# Patient Record
Sex: Male | Born: 1960 | Race: White | Hispanic: No | Marital: Married | Smoking: Never smoker
Health system: Southern US, Community
[De-identification: ages and names within clinical notes are randomized; demographics above are authoritative.]

## PROBLEM LIST (undated history)

## (undated) DIAGNOSIS — I10 Essential (primary) hypertension: Secondary | ICD-10-CM

## (undated) DIAGNOSIS — Z8619 Personal history of other infectious and parasitic diseases: Secondary | ICD-10-CM

## (undated) HISTORY — DX: Personal history of other infectious and parasitic diseases: Z86.19

## (undated) HISTORY — DX: Essential (primary) hypertension: I10

---

## 1998-09-27 HISTORY — PX: HERNIA REPAIR: SHX51

## 1999-02-23 ENCOUNTER — Encounter: Payer: Self-pay | Admitting: Emergency Medicine

## 1999-02-23 ENCOUNTER — Emergency Department (HOSPITAL_COMMUNITY): Admission: EM | Admit: 1999-02-23 | Discharge: 1999-02-23 | Payer: Self-pay | Admitting: Emergency Medicine

## 1999-02-26 ENCOUNTER — Emergency Department (HOSPITAL_COMMUNITY): Admission: EM | Admit: 1999-02-26 | Discharge: 1999-02-26 | Payer: Self-pay | Admitting: Emergency Medicine

## 1999-02-27 ENCOUNTER — Emergency Department (HOSPITAL_COMMUNITY): Admission: EM | Admit: 1999-02-27 | Discharge: 1999-02-28 | Payer: Self-pay | Admitting: Emergency Medicine

## 1999-02-28 ENCOUNTER — Encounter: Payer: Self-pay | Admitting: Emergency Medicine

## 1999-02-28 ENCOUNTER — Inpatient Hospital Stay (HOSPITAL_COMMUNITY): Admission: EM | Admit: 1999-02-28 | Discharge: 1999-03-01 | Payer: Self-pay | Admitting: *Deleted

## 1999-09-25 ENCOUNTER — Emergency Department (HOSPITAL_COMMUNITY): Admission: EM | Admit: 1999-09-25 | Discharge: 1999-09-25 | Payer: Self-pay | Admitting: Emergency Medicine

## 2005-04-29 ENCOUNTER — Ambulatory Visit: Payer: Self-pay | Admitting: Internal Medicine

## 2005-05-17 ENCOUNTER — Ambulatory Visit: Payer: Self-pay | Admitting: Internal Medicine

## 2005-12-02 ENCOUNTER — Ambulatory Visit: Payer: Self-pay | Admitting: Internal Medicine

## 2006-12-08 ENCOUNTER — Ambulatory Visit: Payer: Self-pay | Admitting: Internal Medicine

## 2007-01-10 ENCOUNTER — Ambulatory Visit: Payer: Self-pay | Admitting: Internal Medicine

## 2007-05-04 ENCOUNTER — Ambulatory Visit: Payer: Self-pay | Admitting: Internal Medicine

## 2007-05-04 DIAGNOSIS — I10 Essential (primary) hypertension: Secondary | ICD-10-CM | POA: Insufficient documentation

## 2007-05-04 DIAGNOSIS — F411 Generalized anxiety disorder: Secondary | ICD-10-CM | POA: Insufficient documentation

## 2007-05-04 LAB — CONVERTED CEMR LAB
ALT: 21 units/L (ref 0–53)
AST: 19 units/L (ref 0–37)
Albumin: 3.9 g/dL (ref 3.5–5.2)
Alkaline Phosphatase: 71 units/L (ref 39–117)
BUN: 7 mg/dL (ref 6–23)
Basophils Absolute: 0.1 10*3/uL (ref 0.0–0.1)
Basophils Relative: 0.6 % (ref 0.0–1.0)
Bilirubin Urine: NEGATIVE
Bilirubin, Direct: 0.1 mg/dL (ref 0.0–0.3)
Blood in Urine, dipstick: NEGATIVE
CO2: 29 meq/L (ref 19–32)
Calcium: 9.9 mg/dL (ref 8.4–10.5)
Chloride: 104 meq/L (ref 96–112)
Cholesterol: 192 mg/dL (ref 0–200)
Creatinine, Ser: 0.9 mg/dL (ref 0.4–1.5)
Eosinophils Absolute: 0.2 10*3/uL (ref 0.0–0.6)
Eosinophils Relative: 1.5 % (ref 0.0–5.0)
GFR calc Af Amer: 118 mL/min
GFR calc non Af Amer: 97 mL/min
Glucose, Bld: 108 mg/dL — ABNORMAL HIGH (ref 70–99)
Glucose, Urine, Semiquant: NEGATIVE
HCT: 42.2 % (ref 39.0–52.0)
HDL: 36.2 mg/dL — ABNORMAL LOW (ref >39.0)
Hemoglobin: 14.6 g/dL (ref 13.0–17.0)
Ketones, urine, test strip: NEGATIVE
LDL Cholesterol: 126 mg/dL — ABNORMAL HIGH (ref 0–99)
Lymphocytes Relative: 29.2 % (ref 12.0–46.0)
MCHC: 34.6 g/dL (ref 30.0–36.0)
MCV: 86.5 fL (ref 78.0–100.0)
Monocytes Absolute: 0.7 10*3/uL (ref 0.2–0.7)
Monocytes Relative: 6.4 % (ref 3.0–11.0)
Neutro Abs: 6.4 10*3/uL (ref 1.4–7.7)
Neutrophils Relative %: 62.3 % (ref 43.0–77.0)
Nitrite: NEGATIVE
Platelets: 260 10*3/uL (ref 150–400)
Potassium: 4.2 meq/L (ref 3.5–5.1)
Protein, U semiquant: NEGATIVE
RBC: 4.88 M/uL (ref 4.22–5.81)
RDW: 12.1 % (ref 11.5–14.6)
Sodium: 138 meq/L (ref 135–145)
Specific Gravity, Urine: 1.02
TSH: 2.06 microintl units/mL (ref 0.35–5.50)
Total Bilirubin: 1.1 mg/dL (ref 0.3–1.2)
Total CHOL/HDL Ratio: 5.3
Total Protein: 6.8 g/dL (ref 6.0–8.3)
Triglycerides: 149 mg/dL (ref 0–149)
Urobilinogen, UA: 0.2
VLDL: 30 mg/dL (ref 0–40)
WBC Urine, dipstick: NEGATIVE
WBC: 10.4 10*3/uL (ref 4.5–10.5)
pH: 6

## 2007-05-11 ENCOUNTER — Ambulatory Visit: Payer: Self-pay | Admitting: Internal Medicine

## 2007-07-03 ENCOUNTER — Telehealth: Payer: Self-pay | Admitting: Internal Medicine

## 2008-08-16 ENCOUNTER — Telehealth: Payer: Self-pay | Admitting: Internal Medicine

## 2010-03-23 ENCOUNTER — Ambulatory Visit: Payer: Self-pay | Admitting: Family Medicine

## 2010-03-23 DIAGNOSIS — L219 Seborrheic dermatitis, unspecified: Secondary | ICD-10-CM

## 2010-03-23 DIAGNOSIS — L570 Actinic keratosis: Secondary | ICD-10-CM

## 2010-03-23 LAB — CONVERTED CEMR LAB
ALT: 13 units/L (ref 0–53)
AST: 12 units/L (ref 0–37)
Albumin: 4.3 g/dL (ref 3.5–5.2)
Alkaline Phosphatase: 94 units/L (ref 39–117)
BUN: 11 mg/dL (ref 6–23)
CO2: 26 meq/L (ref 19–32)
Calcium: 9.8 mg/dL (ref 8.4–10.5)
Chloride: 104 meq/L (ref 96–112)
Creatinine, Ser: 0.8 mg/dL (ref 0.40–1.50)
Glucose, Bld: 92 mg/dL (ref 70–99)
Potassium: 4.2 meq/L (ref 3.5–5.3)
Sodium: 141 meq/L (ref 135–145)
Total Bilirubin: 0.7 mg/dL (ref 0.3–1.2)
Total Protein: 7 g/dL (ref 6.0–8.3)

## 2010-05-06 ENCOUNTER — Ambulatory Visit: Payer: Self-pay | Admitting: Family Medicine

## 2010-08-31 ENCOUNTER — Ambulatory Visit: Payer: Self-pay | Admitting: Family Medicine

## 2010-10-27 NOTE — Assessment & Plan Note (Signed)
Summary: f/up,tcb   Vital Signs:  Patient profile:   50 year old male Height:      71.5 inches Weight:      217 pounds BMI:     29.95 BSA:     2.20 Temp:     98.0 degrees F Pulse rate:   63 / minute BP sitting:   160 / 100  Vitals Entered By: Jone Baseman CMA (May 06, 2010 9:39 AM) CC: f/u BP Is Patient Diabetic? No Pain Assessment Patient in pain? no        CC:  f/u BP.  History of Present Illness: HYPERTENSION Disease Monitoring   Blood pressure range: with his home meter readings range from 152/98 to 129/78 most below 140/90      Chest pain:  N    Dyspnea:N Medications   Compliance: none   Lightheadedness: N     Edema:N Prevention   Exercise: runs 3 times a week    Salt restriction:Y  Seb K no change in lesion on his Left arm  ROS - as above PMH - Medications reviewed and updated in medication list.  Smoking Status noted in VS form      Habits & Providers  Alcohol-Tobacco-Diet     Tobacco Status: never  Current Medications (verified): 1)  Triamcinolone Acetonide 0.1 % Oint (Triamcinolone Acetonide) .... Apply Two Times A Day To Rash Behind Ear 45 Gram  Allergies: No Known Drug Allergies  Physical Exam  General:  Well-developed,well-nourished,in no acute distress; alert,appropriate and cooperative throughout examination Lungs:  Normal respiratory effort, chest expands symmetrically. Lungs are clear to auscultation, no crackles or wheezes. Heart:  Normal rate and regular rhythm. S1 and S2 normal without gallop, murmur, click, rub or other extra sounds. Skin:  typical stuck on appearance of dark seb k on Left arm measures 7 mm    Impression & Recommendations:  Problem # 1:  HYPERTENSION (ICD-401.9)  Elevated on visits but according to home readings (his meter actually measures higher than ours) his blood pressure is well controlled.  Likely white coat hypertension.  Will monitor  BP today: 160/100 Prior BP: 162/96 (03/23/2010)  Labs  Reviewed: K+: 4.2 (03/23/2010) Creat: : 0.80 (03/23/2010)   Chol: 192 (05/04/2007)   HDL: 36.2 (05/04/2007)   LDL: 126 (05/04/2007)   TG: 149 (05/04/2007)  Orders: [facility]- Est Level  3 (60454)  Problem # 2:  ACTINIC KERATOSIS (ICD-702.0) Froze thaw froze today.  No complications  Complete Medication List: 1)  Triamcinolone Acetonide 0.1 % Oint (Triamcinolone acetonide) .... Apply two times a day to rash behind ear 45 gram  Patient Instructions: 1)  Please schedule a follow-up appointment in 6 months .  2)  Check your blood pressure regularly if usually above 150/95 with your meter then call us 3)  Bring in your readings next visit 4)  Try to exercise at least 4-5 days a week  Prevention & Chronic Care Immunizations   Influenza vaccine: Not documented    Tetanus booster: Not documented    Pneumococcal vaccine: Not documented  Other Screening   Smoking status: never  (05/06/2010)  Lipids   Total Cholesterol: 192  (05/04/2007)   LDL: 126  (05/04/2007)   LDL Direct: Not documented   HDL: 36.2  (05/04/2007)   Triglycerides: 149  (05/04/2007)  Hypertension   Last Blood Pressure: 160 / 100  (05/06/2010)   Serum creatinine: 0.80  (03/23/2010)   Serum potassium 4.2  (03/23/2010)    Hypertension flowsheet reviewed?: Yes  Progress toward BP goal: At goal  Self-Management Support :    Hypertension self-management support: Not documented   Prevention & Chronic Care Immunizations   Influenza vaccine: Not documented    Tetanus booster: Not documented    Pneumococcal vaccine: Not documented  Other Screening   Smoking status: never  (05/06/2010)  Lipids   Total Cholesterol: 192  (05/04/2007)   LDL: 126  (05/04/2007)   LDL Direct: Not documented   HDL: 36.2  (05/04/2007)   Triglycerides: 149  (05/04/2007)  Hypertension   Last Blood Pressure: 160 / 100  (05/06/2010)   Serum creatinine: 0.80  (03/23/2010)   Serum potassium 4.2  (03/23/2010)    Hypertension  flowsheet reviewed?: Yes   Progress toward BP goal: At goal  Self-Management Support :    Hypertension self-management support: Not documented

## 2010-10-27 NOTE — Assessment & Plan Note (Signed)
Summary: NP,tcb   Vital Signs:  Patient profile:   50 year old male Height:      71.5 inches Weight:      217.4 pounds BMI:     30.01 Temp:     98.0 degrees F oral Pulse rate:   66 / minute BP sitting:   162 / 96  (left arm) Cuff size:   regular  Vitals Entered By: Garen Grams LPN (March 23, 2010 2:51 PM) CC: New Patient Is Patient Diabetic? No Pain Assessment Patient in pain? no        CC:  New Patient.  History of Present Illness: Feels well except  Scaly irritated area behind R ear. Has been present for years.  No other similar lesions.  Does wax and wane.  Uses dandruff shampoo.  No pain or discharge  Scaly lesion on L forearm.   flakes off and returns.  No bleeding or pain   Habits & Providers  Alcohol-Tobacco-Diet     Alcohol drinks/day: 0     Tobacco Status: never  Exercise-Depression-Behavior     Does Patient Exercise: yes     STD Risk: never     Drug Use: never     Seat Belt Use: always  Current Medications (verified): 1)  Triamcinolone Acetonide 0.1 % Oint (Triamcinolone Acetonide) .... Apply Two Times A Day To Rash Behind Ear 45 Gram  Allergies: No Known Drug Allergies  Family History: positive for hypertension in mother, paternal grandmother had stomach cancer  Social History: Married - [place].  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 Enjoys cooking, and avid [employer].  As of 6-11 His stepson and family (2 kids 3 and 66 yo) are living with him  Smoking Status:  never Does Patient Exercise:  yes STD Risk:  never Drug Use:  never Seat Belt Use:  always  Review of Systems  The patient denies anorexia, fever, weight loss, weight gain, vision loss, decreased hearing, hoarseness, chest pain, syncope, dyspnea on exertion, peripheral edema, prolonged cough, headaches, hemoptysis, abdominal pain, melena, hematochezia, severe indigestion/heartburn, hematuria, incontinence, genital sores, muscle weakness, suspicious skin lesions, transient blindness,  difficulty walking, depression, unusual weight change, abnormal bleeding, enlarged lymph nodes, angioedema, and testicular masses.    Physical Exam  General:  Well-developed,well-nourished,in no acute distress; alert,appropriate and cooperative throughout examination Head:  Normocephalic and atraumatic without obvious abnormalities. No apparent alopecia or balding. Eyes:  wears contacts Ears:  External ear exam shows no significant lesions or deformities.  Otoscopic examination reveals clear canals, tympanic membranes are intact bilaterally without bulging, retraction, inflammation or discharge. Hearing is grossly normal bilaterally. Nose:  External nasal examination shows no deformity or inflammation. Nasal mucosa are pink and moist without lesions or exudates. Mouth:  Oral mucosa and oropharynx without lesions or exudates.  Teeth in good repair. Neck:  No deformities, masses, or tenderness noted. Lungs:  Normal respiratory effort, chest expands symmetrically. Lungs are clear to auscultation, no crackles or wheezes. Heart:  Normal rate and regular rhythm. S1 and S2 normal without gallop, murmur, click, rub or other extra sounds. Abdomen:  Bowel sounds positive,abdomen soft and non-tender without masses, organomegaly or hernias noted. Msk:  No deformity or scoliosis noted of thoracic or lumbar spine.   Extremities:  No clubbing, cyanosis, edema, or deformity noted with normal full range of motion of all joints.   Skin:  3 cm oblong area of scaly slightly cracked skin above R ear not well circumscribed 1 cm indistinct scaly lesion L  forearm  Cervical Nodes:  No lymphadenopathy noted Inguinal Nodes:  No significant adenopathy Psych:  Cognition and judgment appear intact. Alert and cooperative with normal attention span and concentration. No apparent delusions, illusions, hallucinations   Impression & Recommendations:  Problem # 1:  HYPERTENSION (ICD-401.9) Last on medications 3 years ago.   Checks at home and is usually in low 140s/90s.  No other risk factors for coronary artery disease.   Will monitor.   The following medications were removed from the medication list:    Benazepril Hcl 20 Mg Tabs (Benazepril hcl) .Marland Kitchen... 1 once daily    Hydrochlorothiazide 25 Mg Tabs (Hydrochlorothiazide) .Marland Kitchen... 1 once daily  Orders: Comp Met-[facility] (04540-98119)  Problem # 2:  ACTINIC KERATOSIS (ICD-702.0) on forearm - come back to freeze  Problem # 3:  SEBORRHEA (ICD-706.3) behind R ear - steroid ointment  Complete Medication List: 1)  Triamcinolone Acetonide 0.1 % Oint (Triamcinolone acetonide) .... Apply two times a day to rash behind ear 45 gram  Patient Instructions: 1)  Please schedule a follow-up appointment in 1 month.  2)  Take your blood pressure evey other day and write down your readings and bring in with your cuff next visit 3)  I will call you if your lab is abnormal otherwise I will discuss the results during our next visit 4)  We can freeze your skin lesion next visit 5)  To lower your blood pressure cut back on adding salt and salty foods  Prescriptions: TRIAMCINOLONE ACETONIDE 0.1 % OINT (TRIAMCINOLONE ACETONIDE) apply two times a day to rash behind ear 45 gram  #1 x 3   Entered and Authorized by:   Pearlean Brownie MD   Signed by:   Pearlean Brownie MD on 03/23/2010   Method used:   Electronically to        [facility] [address] [phone]* (retail)       [address]       [place], [place]  [address]       Ph: [phone] or [phone]       Fax: [phone]   RxID:   [id number]

## 2010-10-27 NOTE — Assessment & Plan Note (Signed)
Summary: ears clogged/eo   Vital Signs:  Patient profile:   50 year old male Height:      71.5 inches Weight:      217.2 pounds BMI:     29.98 Temp:     98.1 degrees F oral Pulse rate:   80 / minute BP sitting:   160 / 108  (right arm) Cuff size:   regular  Vitals Entered By: Garen Grams LPN (August 31, 2010 2:18 PM) CC: ears clogged Is Patient Diabetic? No Pain Assessment Patient in pain? no        CC:  ears clogged.  History of Present Illness: Ears clogged happens every so often.  No pain or fever or discharge Uses over the counter syringe but no softner   HYPERTENSION Disease Monitoring   Blood pressure range:brings in his diary with dates and bps.  90% are less than 140/90       Chest pain: N     Dyspnea:N Medications   Compliance: no meds   Lightheadedness: N     Edema:N Prevention   Exercise: running regularly 3 x per week    Salt restriction:doesn't use when eats at home but sometimes when eating fast food  ROS - as above PMH - Medications reviewed and updated in medication list.  Smoking Status noted in VS form      Habits & Providers  Alcohol-Tobacco-Diet     Alcohol drinks/day: 0     Tobacco Status: never  Current Medications (verified): 1)  Triamcinolone Acetonide 0.1 % Oint (Triamcinolone Acetonide) .... Apply Two Times A Day To Rash Behind Ear 45 Gram  Allergies: No Known Drug Allergies  Physical Exam  General:  Well-developed,well-nourished,in no acute distress; alert,appropriate and cooperative throughout examination Ears:  canals obstructed bilaterally.  After irrigation both  TMs visible    Impression & Recommendations:  Problem # 1:  CERUMEN IMPACTION, BILATERAL (ICD-380.4)  resolved with irrigation  Orders: Cerumen Impaction Removal-[facility] (16109) [facility]- Est Level  3 (60454)  Problem # 2:  HYPERTENSION (ICD-401.9)  elevated in office but not on reliable home monitoring so will continue to observe   Orders: [facility]- Est Level   3 (09811)  Complete Medication List: 1)  Triamcinolone Acetonide 0.1 % Oint (Triamcinolone acetonide) .... Apply two times a day to rash behind ear 45 gram  Other Orders: Flu Vaccine 65yrs + (91478) Admin 1st Vaccine (29562)  Patient Instructions: 1)  Ear Wax softner - Debrox over the counter can use once a month or so 2)  Checkyour blood pressure regularly if usually > 140/90 either top or bottom number then call us 3)  Keep up the running that great 4)  Consider losing a few lbs and cutting back on salt   Orders Added: 1)  Flu Vaccine 39yrs + [90658] 2)  Admin 1st Vaccine [90471] 3)  Cerumen Impaction Removal-[facility] [13086] 4)  [facility]- Est Level  3 [57846]   Immunizations Administered:  Influenza Vaccine # 1:    Vaccine Type: Fluvax 3+    Site: left deltoid    Mfr: GlaxoSmithKline    Dose: 0.5 ml    Route: IM    Given by: Jone Baseman CMA    Exp. Date: 03/27/2011    Lot #: [id number]    VIS given: 04/21/10 version given August 31, 2010.  Flu Vaccine Consent Questions:    Do you have a history of severe allergic reactions to this vaccine? no    Any prior history of  allergic reactions to egg and/or gelatin? no    Do you have a sensitivity to the preservative Thimersol? no    Do you have a past history of Guillan-Barre Syndrome? no    Do you currently have an acute febrile illness? no    Have you ever had a severe reaction to latex? no    Vaccine information given and explained to patient? yes   Immunizations Administered:  Influenza Vaccine # 1:    Vaccine Type: Fluvax 3+    Site: left deltoid    Mfr: GlaxoSmithKline    Dose: 0.5 ml    Route: IM    Given by: Jone Baseman CMA    Exp. Date: 03/27/2011    Lot #: [id number]    VIS given: 04/21/10 version given August 31, 2010.  Prevention & Chronic Care Immunizations   Influenza vaccine: Fluvax 3+  (08/31/2010)    Tetanus booster: Not documented    Pneumococcal vaccine: Not documented  Other  Screening   Smoking status: never  (08/31/2010)  Lipids   Total Cholesterol: 192  (05/04/2007)   LDL: 126  (05/04/2007)   LDL Direct: Not documented   HDL: 36.2  (05/04/2007)   Triglycerides: 149  (05/04/2007)  Hypertension   Last Blood Pressure: 160 / 108  (08/31/2010)   Serum creatinine: 0.80  (03/23/2010)   Serum potassium 4.2  (03/23/2010)  Self-Management Support :    Hypertension self-management support: Not documented

## 2010-12-09 ENCOUNTER — Ambulatory Visit (INDEPENDENT_AMBULATORY_CARE_PROVIDER_SITE_OTHER): Payer: Managed Care, Other (non HMO) | Admitting: Family Medicine

## 2010-12-09 ENCOUNTER — Encounter: Payer: Self-pay | Admitting: Family Medicine

## 2010-12-09 VITALS — BP 158/96 | HR 64 | Temp 98.0°F | Wt 213.0 lb

## 2010-12-09 DIAGNOSIS — Z23 Encounter for immunization: Secondary | ICD-10-CM

## 2010-12-09 DIAGNOSIS — I10 Essential (primary) hypertension: Secondary | ICD-10-CM

## 2010-12-09 LAB — CONVERTED CEMR LAB
Cholesterol: 205 mg/dL — ABNORMAL HIGH (ref 0–200)
HDL: 50 mg/dL (ref >39)
LDL Cholesterol: 134 mg/dL — ABNORMAL HIGH (ref 0–99)
Total CHOL/HDL Ratio: 4.1
Triglycerides: 106 mg/dL (ref <150)
VLDL: 21 mg/dL (ref 0–40)

## 2010-12-09 LAB — LIPID PANEL
Cholesterol: 205 mg/dL — ABNORMAL HIGH (ref 0–200)
HDL: 50 mg/dL (ref >39)
LDL Cholesterol: 134 mg/dL — ABNORMAL HIGH (ref 0–99)
Total CHOL/HDL Ratio: 4.1 Ratio
Triglycerides: 106 mg/dL (ref <150)
VLDL: 21 mg/dL (ref 0–40)

## 2010-12-09 NOTE — Progress Notes (Signed)
  Subjective:    Patient ID: Larry Leon, male    DOB: Oct 04, 1960, 50 y.o.   MRN: [id number]  HPI  HYPERTENSION Disease Monitoring Blood pressure range-brings in a list of readings approx 3 x per week.  Less than 10% are > 140/90 many are in 120/65 range Chest pain- No runs regularlyar      Dyspnea- no Medications Compliance- watching wt and salt intake Lightheadedness- no   Edema- only when rides long distances    Review of Systems see abov3     Objective:   Physical Exam  Heart:  Normal rate and regular rhythm. S1 and S2 normal without gallop, murmur, click, rub or other extra sounds Lungs:  Normal respiratory effort, chest expands symmetrically. Lungs are clear to auscultation, no crackles or wheezes. Extremities:  No cyanosis, edema, or deformity noted with good range of motion of all major joints.          Assessment & Plan:

## 2010-12-09 NOTE — Patient Instructions (Signed)
I will call you if your lab tests are not normal.  Otherwise you should receive a letter within 2 weeks.  If you do not hear from me by that time please call my office. Keep checking your bp.  If it is regularly > 140 or > 90 then call our office Keep running that is great for your health

## 2010-12-09 NOTE — Assessment & Plan Note (Signed)
Home blood pressure readings are quite good.  I suspect a significant white coat reaction.  Discussed pros and cons of medications.  He would rather continue without meds.   He is fit and has no other risk factors so I agree.  Will check FLP.  If high might consider medication treatment

## 2010-12-15 ENCOUNTER — Encounter: Payer: Self-pay | Admitting: Family Medicine

## 2011-03-01 ENCOUNTER — Encounter: Payer: Self-pay | Admitting: Family Medicine

## 2011-03-01 ENCOUNTER — Ambulatory Visit (INDEPENDENT_AMBULATORY_CARE_PROVIDER_SITE_OTHER): Payer: Managed Care, Other (non HMO) | Admitting: Family Medicine

## 2011-03-01 DIAGNOSIS — I10 Essential (primary) hypertension: Secondary | ICD-10-CM

## 2011-03-01 DIAGNOSIS — H6120 Impacted cerumen, unspecified ear: Secondary | ICD-10-CM | POA: Insufficient documentation

## 2011-03-01 NOTE — Assessment & Plan Note (Signed)
Improved after irrigation.  Discussed wax softner regular use

## 2011-03-01 NOTE — Patient Instructions (Signed)
Use Debrox ear drops 2-3 drops once a day in the R ear for the next 2-5 days Use the Debrox once a month or so to keep the wax down  Call if your blood pressure is regularly > 150/90

## 2011-03-01 NOTE — Assessment & Plan Note (Signed)
Elevated in office but seems to be good historian and home readings are normal.  Continue to watch .   He has not been exercising as much due to overuse injury

## 2011-03-01 NOTE — Progress Notes (Signed)
  Subjective:    Patient ID: Larry Leon, male    DOB: 14-Jan-1961, 50 y.o.   MRN: [id number]  HPI  Cerumen Impaction Ears have filled up as often happens.  Has not been using any wax softner.  No pain or fever or discharge  HYPERTENSION Disease Monitoring Blood pressure range-almost all are below 140/85 at home Chest pain- no      Dyspnea- no Medications Compliance- no medications Lightheadedness- no   Edema- no  Review of Symptoms - see HPI  PMH - Smoking status noted.      Review of Systems     Objective:   Physical Exam    Ear - after irrigation.  R canal small amount of residual wax  L canal is clear.  Slightly red without discharge     Assessment & Plan:

## 2011-08-09 ENCOUNTER — Ambulatory Visit (INDEPENDENT_AMBULATORY_CARE_PROVIDER_SITE_OTHER): Payer: Managed Care, Other (non HMO) | Admitting: Family Medicine

## 2011-08-09 ENCOUNTER — Encounter: Payer: Self-pay | Admitting: Family Medicine

## 2011-08-09 DIAGNOSIS — I10 Essential (primary) hypertension: Secondary | ICD-10-CM

## 2011-08-09 DIAGNOSIS — H6120 Impacted cerumen, unspecified ear: Secondary | ICD-10-CM

## 2011-08-09 NOTE — Assessment & Plan Note (Signed)
Not controlled in the office but is by home readings as has been in the past.  Discussed ambulatory blood pressure monitoring he is agreeable but not enthusiastic.  Will refer.   No medications for now

## 2011-08-09 NOTE — Progress Notes (Signed)
  Subjective:    Patient ID: Larry Leon, male    DOB: 24-May-1961, 50 y.o.   MRN: [id number]  HPI  Cerumen Impaction For the last week.  Hearing decreased in R > left ears.  No pain or discharge or fever  HYPERTENSION Disease Monitoring Home BP Monitoring brings in a diary almost daily.  Usually in 130s/80s highest upper 140s/upper 80s Chest pain- no     Dyspnea-  no  Medications Compliance: not currently on medications for this problem. Lightheadedness-  no  Edema-  no Runs about 4 miles 3 x a week  ROS - See HPI  PMH Lab Review   Potassium  Date Value Range Status  03/23/2010 4.2  3.5-5.3 (meq/L) Final     Sodium  Date Value Range Status  03/23/2010 141  135-145 (meq/L) Final      Review of Symptoms - see HPI  PMH - Smoking status noted.       Review of Systems     Objective:   Physical Exam  Heart - Regular rate and rhythm.  No murmurs, gallops or rubs.    Lungs:  Normal respiratory effort, chest expands symmetrically. Lungs are clear to auscultation, no crackles or wheezes. Extremities:  No cyanosis, edema, or deformity noted with good range of motion of all major joints.   Ears:  External ear exam shows no significant lesions or deformities.  Otoscopic examination reveals cerumen impaction fully on R partilally on the left     After irrigation tympanic membranes are intact bilaterally without bulging, retraction, inflammation or discharge.  R ear canal is slightly red after washing. Hearing is grossly normal bilaterall      Assessment & Plan:

## 2011-08-09 NOTE — Assessment & Plan Note (Signed)
Recurrent.  See patient instructions

## 2011-08-09 NOTE — Patient Instructions (Signed)
Use ear wax softner in each ear 3-4 days every month and then wash out with the bulb and warm water  Call if you have any pain in your ears  Dr Raymondo Band our pharmacist will contact you about setting up a time to get the 24 hours blood pressure monitor

## 2011-10-20 ENCOUNTER — Ambulatory Visit: Payer: Managed Care, Other (non HMO) | Admitting: Family Medicine

## 2011-10-21 ENCOUNTER — Other Ambulatory Visit: Payer: Self-pay | Admitting: Family Medicine

## 2011-10-21 ENCOUNTER — Ambulatory Visit (INDEPENDENT_AMBULATORY_CARE_PROVIDER_SITE_OTHER): Payer: Managed Care, Other (non HMO) | Admitting: Family Medicine

## 2011-10-21 ENCOUNTER — Encounter: Payer: Self-pay | Admitting: Family Medicine

## 2011-10-21 VITALS — BP 200/104 | HR 59 | Temp 97.5°F | Ht 71.5 in | Wt 207.9 lb

## 2011-10-21 DIAGNOSIS — I10 Essential (primary) hypertension: Secondary | ICD-10-CM

## 2011-10-21 DIAGNOSIS — L989 Disorder of the skin and subcutaneous tissue, unspecified: Secondary | ICD-10-CM

## 2011-10-21 NOTE — Patient Instructions (Signed)
I am very concerned about your blood pressure.  If you change yoru mind, you can set up ambulatory blood pressure monitoring at any time.  Keep bandage on for 1 day, the shower and bathe as normal  Apply petroleum jelly or antibiotic ointment to keep the area moist while healing and to minimize scarring.  Apply a bandage over the site.  Let us know if you notice bleeding, redness, swelling, or other signs of infection.  Pathology results will return in 1-2 weeks. We will send you a letter with results or give you a call if further discussion is needed.

## 2011-10-21 NOTE — Assessment & Plan Note (Signed)
Patient declines medications therapy as well as ambulatory BP monitoring.  Would like to continue with therapeutic lifestyle modification.  We discussed risks of elevated blood pressure to include heart attack and stroke.  Will follow-up with his PCP.

## 2011-10-21 NOTE — Progress Notes (Signed)
  Subjective:    Patient ID: Larry Leon, male    DOB: 06-Jul-1961, 51 y.o.   MRN: [id number]  HPI 51 yo here for work in appt for evaluation of skin lesion  Left side of neck, present for 4-6 weeks.  No itching, pain, or bleeding, but  feel like it has grown in that time and failed to heal despite no recurrent trauma.  Hypertension:  Blood pressure high today.  Consistent with previous blood pressures.  He has declines medications and ambulatory monitoring in the past.  Feels his home blood pressure monitoring is adequate and reports systolic BP's 130-140's at home.  No chest pain, dyspnea.  I have reviewed patient's  PMH, FH, and Social history and Medications as related to this visit. No history of skin cancer Review of Systems see above     Objective:   Physical Exam GEN: Alert & Oriented, No acute distress Neck: no cervical LAD Skin:  Left neck skin colored papule with scabbing on top. approx 2.5x3.0 mm No surrounding erythema.  No blood vessels.  No pain.  Shave biopsy of left neck lesion Indication: nonhealing skin lesion, growing Patient consented, time out performed.   Anesthetized with 0.5 cc of 1% lidocaine with epi Shave biopsy performed with good hemostasis,  Patient tolerated procedure well.       Assessment & Plan:

## 2011-10-21 NOTE — Assessment & Plan Note (Signed)
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  Shave biopsy performed and sent to pathology.  Discussed aftercare and red flags for medical care.  Will notify patient of pathology report.

## 2011-10-25 ENCOUNTER — Encounter: Payer: Self-pay | Admitting: Family Medicine

## 2016-04-26 ENCOUNTER — Telehealth: Payer: Self-pay | Admitting: General Practice

## 2016-04-26 NOTE — Telephone Encounter (Signed)
Okay 

## 2016-04-26 NOTE — Telephone Encounter (Signed)
Pt states he was a pt of your many years ago, and his wife, Yaden Dralle sees you as well. Would like to know if you will accept him back as a pt?

## 2016-05-03 NOTE — Telephone Encounter (Signed)
Pt has been scheduled.  °

## 2016-06-22 ENCOUNTER — Encounter: Payer: Self-pay | Admitting: Internal Medicine

## 2016-06-22 ENCOUNTER — Ambulatory Visit (INDEPENDENT_AMBULATORY_CARE_PROVIDER_SITE_OTHER): Payer: BLUE CROSS/BLUE SHIELD | Admitting: Internal Medicine

## 2016-06-22 VITALS — BP 160/10 | HR 81 | Temp 99.1°F | Resp 20 | Ht 70.75 in | Wt 223.2 lb

## 2016-06-22 DIAGNOSIS — Z23 Encounter for immunization: Secondary | ICD-10-CM

## 2016-06-22 DIAGNOSIS — I10 Essential (primary) hypertension: Secondary | ICD-10-CM

## 2016-06-22 DIAGNOSIS — Z0000 Encounter for general adult medical examination without abnormal findings: Secondary | ICD-10-CM | POA: Diagnosis not present

## 2016-06-22 DIAGNOSIS — Z86010 Personal history of colonic polyps: Secondary | ICD-10-CM | POA: Diagnosis not present

## 2016-06-22 MED ORDER — AMLODIPINE BESYLATE 5 MG PO TABS: 5.0000 mg | ORAL_TABLET | Freq: Every day | ORAL | 3 refills | Status: DC

## 2016-06-22 NOTE — Progress Notes (Signed)
Subjective:    Patient ID: Larry Leon, male    DOB: 10/19/1960, 55 y.o.   MRN: [id number]  HPI 55 year old patient who is seen today to reestablish with our practice. He has a history of essential hypertension and has been on amlodipine for proximally 2 years. Doing well.  No concerns or complaints  Past medical history is fairly unremarkable.  He did have hernia surgery in 1998.  He also has some plastic surgery involving the right facial area as a teenager.  Family history father age 53.  Status post CABG.  Mother died at 2182 of cardiac one brother is in good health  Social history married two-step children, daughter age 53.  Son age 53.  Patient has a BS from [employer] in [place].  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  This revealed a single adenomatous polyp in 5 year follow-up was recommended.  No past medical history on file.   Social History   Social History  . Marital status: Married    Spouse name: N/A  . Number of children: N/A  . Years of education: N/A   Occupational History  . Not on file.   Social History Main Topics  . Smoking status: Never Smoker  . Smokeless tobacco: Never Used  . Alcohol use No  . Drug use: No  . Sexual activity: Yes   Other Topics Concern  . Not on file   Social History Narrative   Married - Larry Neuoni Leon.  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 Enjoys cooking, and avid [employer].        As of 6-11 His stepson and family (2 kids 3 and 235 yo) are living with him       Smoking Status:  Never      Does Patient Exercise:  yes    No past surgical history on file.  Family History  Problem Relation Age of Onset  . Hypertension Mother     No Known Allergies  Current Outpatient Prescriptions on File Prior to Visit  Medication Sig Dispense Refill  . Omega-3 Fatty Acids (FISH OIL) 1000 MG CAPS Take by mouth 2 (two) times daily.      Marland Kitchen. triamcinolone (KENALOG) 0.1 % ointment apply two  times a day to rash behind ear 45 gram      No current facility-administered medications on file prior to visit.     BP (!) 160/10 (BP Location: Left Arm, Patient Position: Sitting, Cuff Size: Normal)   Pulse 81   Temp 99.1 F (37.3 C) (Oral)   Resp 20   Ht 5' 10.75" (1.797 m)   Wt 223 lb 4 oz (101.3 kg)   SpO2 97%   BMI 31.36 kg/m      Review of Systems  Constitutional: Negative for appetite change, chills, fatigue and fever.  HENT: Negative for congestion, dental problem, ear pain, hearing loss, sore throat, tinnitus, trouble swallowing and voice change.   Eyes: Negative for pain, discharge and visual disturbance.  Respiratory: Negative for cough, chest tightness, wheezing and stridor.   Cardiovascular: Negative for chest pain, palpitations and leg swelling.  Gastrointestinal: Negative for abdominal distention, abdominal pain, blood in stool, constipation, diarrhea, nausea and vomiting.  Genitourinary: Negative for difficulty urinating, discharge, flank pain, genital sores, hematuria and urgency.  Musculoskeletal: Negative for arthralgias, back pain, gait problem, joint swelling, myalgias and neck stiffness.  Skin: Negative for rash.  Neurological: Negative for dizziness, syncope,  speech difficulty, weakness, numbness and headaches.  Hematological: Negative for adenopathy. Does not bruise/bleed easily.  Psychiatric/Behavioral: Negative for behavioral problems and dysphoric mood. The patient is not nervous/anxious.        Objective:   Physical Exam  Constitutional: He appears well-developed and well-nourished.  Blood pressure high on arrival but trended down throughout the encounter.  HIS  lowest blood pressure 140/90  HENT:  Head: Normocephalic and atraumatic.  Right Ear: External ear normal.  Left Ear: External ear normal.  Nose: Nose normal.  Mouth/Throat: Oropharynx is clear and moist.  Eyes: Conjunctivae and EOM are normal. Pupils are equal, round, and reactive to  light. No scleral icterus.  Neck: Normal range of motion. Neck supple. No JVD present. No thyromegaly present.  Cardiovascular: Regular rhythm, normal heart sounds and intact distal pulses.  Exam reveals no gallop and no friction rub.   No murmur heard. Pulmonary/Chest: Effort normal and breath sounds normal. He exhibits no tenderness.  Abdominal: Soft. Bowel sounds are normal. He exhibits no distension and no mass. There is no tenderness.  Genitourinary: Penis normal.  Musculoskeletal: Normal range of motion. He exhibits no edema or tenderness.  Lymphadenopathy:    He has no cervical adenopathy.  Neurological: He is alert. He has normal reflexes. No cranial nerve deficit. Coordination normal.  Skin: Skin is warm and dry. No rash noted.  Psychiatric: He has a normal mood and affect. His behavior is normal.          Assessment & Plan:   Preventive health examination Essential hypertension.  Will continue amlodipine 5.  Lifestyle issues discussed.  Home blood pressure monitoring.  Encouraged with goal of blood pressure less than 140 over 90  Return in 6-12 months for follow-up Screening lab.  At that time  Modest weight loss encouraged Continue salt restricted diet  Rogelia Boga

## 2016-06-22 NOTE — Patient Instructions (Signed)
Limit your sodium (Salt) intake  Please check your blood pressure on a regular basis.  If it is consistently greater than 140/90, please make an office appointment.    It is important that you exercise regularly, at least 20 minutes 3 to 4 times per week.  If you develop chest pain or shortness of breath seek  medical attention.     

## 2017-08-05 ENCOUNTER — Other Ambulatory Visit: Payer: Self-pay | Admitting: Internal Medicine

## 2018-07-17 ENCOUNTER — Other Ambulatory Visit: Payer: Self-pay | Admitting: Internal Medicine

## 2018-07-18 ENCOUNTER — Telehealth: Payer: Self-pay | Admitting: Internal Medicine

## 2018-07-18 NOTE — Telephone Encounter (Signed)
Copied from CRM [id number]. Topic: General - Other >> Jul 18, 2018  6:58 PM Stephannie Li, NT wrote: Reason for CRM: patient called asking for a refill for amLODipine (NORVASC) 5 MG tablet. Pt advised that he would need to make an appt with a new provider due to last appt being in 2017. Pt refusing to make appt at this time stating he would like for medication to be refilled because he does not want to pay $40 to come in for an office visit in order to have medication refilled. Pt states he will call back in a month to make the appt once he decides he would like to see due to Dr. Lesia Hausen retiring.

## 2018-07-19 ENCOUNTER — Other Ambulatory Visit: Payer: Self-pay

## 2018-07-19 MED ORDER — AMLODIPINE BESYLATE 5 MG PO TABS: 5.0000 mg | ORAL_TABLET | Freq: Every day | ORAL | 0 refills | Status: DC

## 2018-07-19 NOTE — Telephone Encounter (Signed)
Spoke with pt and scheduled him for NP with [place], sent in 30D rx. Pt aware to keep appt for further refills.

## 2018-08-13 ENCOUNTER — Other Ambulatory Visit: Payer: Self-pay | Admitting: Internal Medicine

## 2018-08-15 NOTE — Telephone Encounter (Signed)
Routing to PCP CMA for refills! 

## 2018-08-16 ENCOUNTER — Ambulatory Visit: Payer: BLUE CROSS/BLUE SHIELD | Admitting: Family Medicine

## 2018-08-16 NOTE — Telephone Encounter (Signed)
Message sent to Dr. Jordan for review and approval. 

## 2018-08-16 NOTE — Telephone Encounter (Unsigned)
Copied from CRM [id number]. Topic: Quick Communication - Appointment Cancellation >> Aug 15, 2018  5:59 PM [name], Kristie S wrote: Patient called to cancel appointment scheduled for 11/20 2:30pm with Dr. [place]. Patient has not rescheduled their appointment.  pt wife, Larry Leon, called to cancel stating they have another very important appt 11/20, they will call later to RS Please advise on no show fee  Route to department's PEC pool.

## 2018-08-17 ENCOUNTER — Telehealth: Payer: Self-pay | Admitting: Family Medicine

## 2018-08-17 NOTE — Telephone Encounter (Signed)
Copied from CRM [id number] [phone]. Topic: Quick Communication - Rx Refill/Question >> Aug 17, 2018  5:04 PM Maia PettiesOrtiz, Kristie S wrote: Medication: amLODipine (NORVASC) 5 MG tablet  - pt has rescheduled new pt/establish appt with Dr. [place] for 08/28/18. He will run out of his medication before that time.  Has the patient contacted their pharmacy? No - 07/18/18 tel enc pt was advised Dr. [place] would only refill for 30 days until appt that was scheduled 11/20. Pt had to cancel that appt due to the sale of his home falling thru and meeting with their realtor that day. He said there were extenuating circumstances surrounding the home and moving out. He is requesting 2 weeks or a month order so he doesn't run out.  Preferred Pharmacy (with phone number or street name): [facility] [phone][id number] [place], [place] - [address] [phone] (Phone) [phone] (Fax)

## 2018-08-18 ENCOUNTER — Other Ambulatory Visit: Payer: Self-pay | Admitting: Family Medicine

## 2018-08-18 MED ORDER — AMLODIPINE BESYLATE 5 MG PO TABS: 5.0000 mg | ORAL_TABLET | Freq: Every day | ORAL | 0 refills | Status: DC

## 2018-08-18 NOTE — Telephone Encounter (Signed)
Message sent to Dr. Jordan for review and approval. 

## 2018-08-18 NOTE — Telephone Encounter (Signed)
I really do not feel comfortable about prescribing a antihypertensive medication to a patient I have not seen before.  Nevertheless I did send a prescription for Norvasc 5 mg to continue taken daily. He needs to continue monitoring BP and keep his next appointment.  Thanks, BJ

## 2018-08-18 NOTE — Telephone Encounter (Signed)
Patient informed that Rx was sent to pharmacy as requested and to continue monitoring B/P. Patient verbalized understanding and stated that he would be here on 08/28/18.

## 2018-08-18 NOTE — Telephone Encounter (Signed)
Pt is asking for more refills of medication. Pt was advised 07/19/18 this would be last refill and he must keep TOC appt. Pt was scheduled for TOC with [place] on 08/16/18 but canceled and rescheduled for 08/28/18.   Pt has not been seen in office since 06/22/16.

## 2018-08-18 NOTE — Telephone Encounter (Signed)
His amlodipine prescription was sent at 1:11 PM when first requested. BJ

## 2018-08-28 ENCOUNTER — Ambulatory Visit: Payer: BLUE CROSS/BLUE SHIELD | Admitting: Family Medicine

## 2018-08-28 ENCOUNTER — Encounter: Payer: Self-pay | Admitting: Family Medicine

## 2018-08-28 VITALS — BP 130/90 | HR 70 | Temp 97.9°F | Resp 12 | Ht 70.75 in | Wt 231.2 lb

## 2018-08-28 DIAGNOSIS — I10 Essential (primary) hypertension: Secondary | ICD-10-CM | POA: Diagnosis not present

## 2018-08-28 DIAGNOSIS — E669 Obesity, unspecified: Secondary | ICD-10-CM | POA: Insufficient documentation

## 2018-08-28 DIAGNOSIS — Z125 Encounter for screening for malignant neoplasm of prostate: Secondary | ICD-10-CM | POA: Diagnosis not present

## 2018-08-28 DIAGNOSIS — Z6832 Body mass index (BMI) 32.0-32.9, adult: Secondary | ICD-10-CM

## 2018-08-28 DIAGNOSIS — L219 Seborrheic dermatitis, unspecified: Secondary | ICD-10-CM

## 2018-08-28 LAB — COMPREHENSIVE METABOLIC PANEL
ALBUMIN: 4.7 g/dL (ref 3.5–5.2)
ALT: 21 U/L (ref 0–53)
AST: 17 U/L (ref 0–37)
Alkaline Phosphatase: 115 U/L (ref 39–117)
BUN: 10 mg/dL (ref 6–23)
CHLORIDE: 98 meq/L (ref 96–112)
CO2: 28 mEq/L (ref 19–32)
Calcium: 10.5 mg/dL (ref 8.4–10.5)
Creatinine, Ser: 0.82 mg/dL (ref 0.40–1.50)
GFR: 103.46 mL/min (ref >60.00)
Glucose, Bld: 79 mg/dL (ref 70–99)
POTASSIUM: 3.8 meq/L (ref 3.5–5.1)
SODIUM: 136 meq/L (ref 135–145)
Total Bilirubin: 0.7 mg/dL (ref 0.2–1.2)
Total Protein: 7.2 g/dL (ref 6.0–8.3)

## 2018-08-28 LAB — VITAMIN D 25 HYDROXY (VIT D DEFICIENCY, FRACTURES): VITD: 21.34 ng/mL — ABNORMAL LOW (ref 30.00–100.00)

## 2018-08-28 LAB — PSA: PSA: 0.45 ng/mL (ref 0.10–4.00)

## 2018-08-28 MED ORDER — AMLODIPINE BESYLATE 5 MG PO TABS: 5.0000 mg | ORAL_TABLET | Freq: Every day | ORAL | 3 refills | Status: DC

## 2018-08-28 NOTE — Assessment & Plan Note (Signed)
No changes in topical steroid,Triamcinolone 0.1% to continue as needed.Small amount at the time and for no more than 14 days straight.

## 2018-08-28 NOTE — Assessment & Plan Note (Signed)
Mild and asymptomatic. Further recommendations will be given according to labs results.

## 2018-08-28 NOTE — Patient Instructions (Addendum)
A few things to remember from today's visit:   Essential hypertension - Plan: Comprehensive metabolic panel  Hypercalcemia - Plan: Calcium, ionized, Parathyroid hormone, intact (no Ca), VITAMIN D 25 Hydroxy (Vit-D Deficiency, Fractures)  Prostate cancer screening - Plan: PSA  Hypercalcemia Hypercalcemia is having too much calcium in the blood. The body needs calcium to make bones and keep them strong. Calcium also helps the muscles, nerves, brain, and heart work the way they should. Most of the calcium in the body is in the bones. There is also some calcium in the blood. Hypercalcemia can happen when calcium comes out of the bones, or when the kidneys are not able to remove calcium from the blood. Hypercalcemia can be mild or severe. What are the causes? There are many possible causes of hypercalcemia. Common causes include:  Hyperparathyroidism. This is a condition in which the body produces too much parathyroid hormone. There are four parathyroid glands in your neck. These glands produce a chemical messenger (hormone) that helps the body absorb calcium from foods and helps your bones release calcium.  Certain kinds of cancer, such as lung cancer, breast cancer, or myeloma.  Less common causes of hypercalcemia include:  Getting too much calcium or vitamin D from your diet.  Kidney failure.  Hyperthyroidism.  Being on bed rest for a long time.  Certain medicines.  Infections.  Sarcoidosis.  What increases the risk? This condition is more likely to develop in:  Women.  People who are 60 years or older.  People who have a family history of hypercalcemia.  What are the signs or symptoms? Mild hypercalcemia that starts slowly may not cause symptoms. Severe, sudden hypercalcemia is more likely to cause symptoms, such as:  Loss of appetite.  Increased thirst and frequent urination.  Fatigue.  Nausea and vomiting.  Headache.  Abdominal pain.  Muscle pain,  twitching, or weakness.  Constipation.  Blood in the urine.  Pain in the side of the back (flank pain).  Anxiety, confusion, or depression.  Irregular heartbeat (arrhythmia).  Loss of consciousness.  How is this diagnosed? This condition may be diagnosed based on:  Your symptoms.  Blood tests.  Urine tests.  X-rays.  Ultrasound.  MRI.  CT scan.  How is this treated? Treatment for hypercalcemia depends on the cause. Treatment may include:  Receiving fluids through an IV tube.  Medicines that keep calcium levels steady after receiving fluids (loop diuretics).  Medicines that keep calcium in your bones (bisphosphonates).  Medicines that lower the calcium level in your blood.  Surgery to remove overactive parathyroid glands.  Follow these instructions at home:  Take over-the-counter and prescription medicines only as told by your health care provider.  Follow instructions from your health care provider about eating or drinking restrictions.  Drink enough fluid to keep your urine clear or pale yellow.  Stay active. Weight-bearing exercise helps to keep calcium in your bones. Follow instructions from your health care provider about what type and level of exercise is safe for you.  Keep all follow-up visits as told by your health care provider. This is important. Contact a health care provider if:  You have a fever.  You have flank or abdominal pain that is getting worse. Get help right away if:  You have severe abdominal or flank pain.  You have chest pain.  You have trouble breathing.  You become very confused and sleepy.  You lose consciousness. This information is not intended to replace advice given to you  by your health care provider. Make sure you discuss any questions you have with your health care provider. Document Released: 11/27/2004 Document Revised: 02/19/2016 Document Reviewed: 01/29/2015 Elsevier Interactive Patient Education  2018  [employer].  Please be sure medication list is accurate. If a new problem present, please set up appointment sooner than planned today.

## 2018-08-28 NOTE — Assessment & Plan Note (Signed)
Adequately controlled. No changes in current management. DASH-low salt diet recommended. Monitor BP regularly. Eye exam recommended annually. F/U in 12 months, before if needed.

## 2018-08-28 NOTE — Progress Notes (Signed)
HPI:   [name] is a 57 y.o. male, who is here today to establish care.  Former PCP: Dr Amador CunasKwiatkowski Last preventive routine visit: 05/2016.  Chronic medical problems: HTN,hypercalcemia,BPH with nocturia (nocturia x 1,stable).  In general he follows a healthful diet, does not exercise regularly.  HyperCa++,denies MS changes, depression,chnages in bowel habits,or cramps.  HTN, currently he is on Amlodipine 5 mg daily. Dx 10+ years. BP readings 130's/70's.  Denies severe/frequent headache, visual changes, chest pain, dyspnea, palpitation, claudication, focal weakness, or edema.  Pruritic rash behind left ear for a while he uses Triamcinolone as needed.  Scaly,not tender. Topical steroid helps, rash is recurrent.  HLD: 11/2010 TC 205, HDL 50,TG 106, and LDL 134. He is on non pharmacologic treatment.   Review of Systems  Constitutional: Negative for activity change, appetite change, fatigue and fever.  HENT: Negative for nosebleeds, sore throat and trouble swallowing.   Eyes: Negative for redness and visual disturbance.  Respiratory: Negative for cough, shortness of breath and wheezing.   Cardiovascular: Negative for chest pain, palpitations and leg swelling.  Gastrointestinal: Negative for abdominal pain, nausea and vomiting.  Endocrine: Negative for polydipsia, polyphagia and polyuria.  Genitourinary: Negative for decreased urine volume, dysuria and hematuria.  Musculoskeletal: Negative for gait problem and myalgias.  Skin: Positive for rash. Negative for wound.  Neurological: Negative for syncope, weakness and headaches.      Current Outpatient Medications on File Prior to Visit  Medication Sig Dispense Refill  . Omega-3 Fatty Acids (FISH OIL) 1000 MG CAPS Take by mouth 2 (two) times daily.      Marland Kitchen. triamcinolone (KENALOG) 0.1 % ointment apply two times a day to rash behind ear 45 gram      No current facility-administered medications on file  prior to visit.      Past Medical History:  Diagnosis Date  . History of chicken pox   . Hypertension    No Known Allergies  Family History  Problem Relation Age of Onset  . Hypertension Mother   . Arthritis Mother   . Hyperlipidemia Father   . Alcohol abuse Brother   . Hypertension Brother   . Alcohol abuse Maternal Grandmother   . Heart attack Maternal Grandmother   . Heart attack Maternal Grandfather   . Cancer Paternal Grandmother   . Stroke Paternal Grandmother   . Heart attack Paternal Grandfather     Social History   Socioeconomic History  . Marital status: Married    Spouse name: Not on file  . Number of children: Not on file  . Years of education: Not on file  . Highest education level: Not on file  Occupational History  . Not on file  Social Needs  . Financial resource strain: Not on file  . Food insecurity:    Worry: Not on file    Inability: Not on file  . Transportation needs:    Medical: Not on file    Non-medical: Not on file  Tobacco Use  . Smoking status: Never Smoker  . Smokeless tobacco: Never Used  Substance and Sexual Activity  . Alcohol use: No  . Drug use: No  . Sexual activity: Yes  Lifestyle  . Physical activity:    Days per week: Not on file    Minutes per session: Not on file  . Stress: Not on file  Relationships  . Social connections:    Talks on phone: Not on file    Gets  together: Not on file    Attends religious service: Not on file    Active member of club or organization: Not on file    Attends meetings of clubs or organizations: Not on file    Relationship status: Not on file  Other Topics Concern  . Not on file  Social History Narrative   Married - Larry Leon.  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 Enjoys cooking, and avid [employer].        As of 6-11 His stepson and family (2 kids 3 and 56 yo) are living with him       Smoking Status:  Never      Does Patient Exercise:  yes    Vitals:   08/28/18 1408  BP:  130/90  Pulse: 70  Resp: 12  Temp: 97.9 F (36.6 C)  SpO2: 95%    Body mass index is 32.48 kg/m.  Physical Exam  Nursing note reviewed. Constitutional: He is oriented to person, place, and time. He appears well-developed. No distress.  HENT:  Head: Normocephalic and atraumatic.  Mouth/Throat: Oropharynx is clear and moist and mucous membranes are normal.  Eyes: Pupils are equal, round, and reactive to light. Conjunctivae are normal.  Cardiovascular: Normal rate and regular rhythm.  No murmur heard. Pulses:      Dorsalis pedis pulses are 2+ on the right side, and 2+ on the left side.  Respiratory: Effort normal and breath sounds normal. No respiratory distress.  GI: Soft. He exhibits no mass. There is no hepatomegaly. There is no tenderness.  Musculoskeletal: He exhibits no edema.  Lymphadenopathy:    He has no cervical adenopathy.  Neurological: He is alert and oriented to person, place, and time. He has normal strength. No cranial nerve deficit. Gait normal.  Skin: Skin is warm. Rash noted. No erythema.  Scaly area behind left ear,no tender,mildly erythematous.  Psychiatric: He has a normal mood and affect. Cognition and memory are normal.  Well groomed, good eye contact.    ASSESSMENT AND PLAN:   [name] was seen today for establish care.  Orders Placed This Encounter  Procedures  . Comprehensive metabolic panel  . Calcium, ionized  . Parathyroid hormone, intact (no Ca)  . VITAMIN D 25 Hydroxy (Vit-D Deficiency, Fractures)  . PSA   Lab Results  Component Value Date   PSA 0.45 08/28/2018   Lab Results  Component Value Date   CREATININE 0.82 08/28/2018   BUN 10 08/28/2018   NA 136 08/28/2018   K 3.8 08/28/2018   CL 98 08/28/2018   CO2 28 08/28/2018   Lab Results  Component Value Date   ALT 21 08/28/2018   AST 17 08/28/2018   ALKPHOS 115 08/28/2018   BILITOT 0.7 08/28/2018     Dermatitis, seborrheic No changes in topical steroid,Triamcinolone  0.1% to continue as needed.Small amount at the time and for no more than 14 days straight.   Hypercalcemia Mild and asymptomatic. Further recommendations will be given according to labs results.  Essential hypertension Adequately controlled. No changes in current management. DASH-low salt diet recommended. Monitor BP regularly. Eye exam recommended annually. F/U in 12 months, before if needed.   Class 1 obesity with serious comorbidity and body mass index (BMI) of 32.0 to 32.9 in adult, unspecified obesity type We discussed benefits of wt loss as well as adverse effects of obesity. Consistency with healthy diet and physical activity recommended.   Prostate cancer screening -     PSA  Maxten Shuler G. Martin[place], MD  [facility]. [place] office.

## 2018-08-29 LAB — CALCIUM, IONIZED: CALCIUM ION: 5.49 mg/dL (ref 4.8–5.6)

## 2018-08-29 LAB — PARATHYROID HORMONE, INTACT (NO CA): PTH: 68 pg/mL — ABNORMAL HIGH (ref 14–64)

## 2018-11-29 ENCOUNTER — Encounter: Payer: Self-pay | Admitting: *Deleted

## 2018-11-29 ENCOUNTER — Ambulatory Visit: Payer: BLUE CROSS/BLUE SHIELD | Admitting: Family Medicine

## 2018-11-29 ENCOUNTER — Encounter: Payer: Self-pay | Admitting: Family Medicine

## 2018-11-29 VITALS — BP 130/88 | HR 90 | Temp 98.7°F | Resp 12 | Ht 70.75 in | Wt 235.1 lb

## 2018-11-29 DIAGNOSIS — R0981 Nasal congestion: Secondary | ICD-10-CM | POA: Diagnosis not present

## 2018-11-29 DIAGNOSIS — H1033 Unspecified acute conjunctivitis, bilateral: Secondary | ICD-10-CM

## 2018-11-29 DIAGNOSIS — J069 Acute upper respiratory infection, unspecified: Secondary | ICD-10-CM | POA: Diagnosis not present

## 2018-11-29 MED ORDER — NEOMYCIN-POLYMYXIN-HC 3.5-10000-1 OP SUSP: 1.0000 [drp] | Freq: Three times a day (TID) | OPHTHALMIC | 0 refills | Status: AC

## 2018-11-29 MED ORDER — FLUTICASONE PROPIONATE 50 MCG/ACT NA SUSP: 1.0000 | Freq: Two times a day (BID) | NASAL | 3 refills | Status: DC

## 2018-11-29 MED ORDER — PREDNISONE 20 MG PO TABS: 40.0000 mg | ORAL_TABLET | Freq: Every day | ORAL | 0 refills | Status: AC

## 2018-11-29 NOTE — Progress Notes (Signed)
ACUTE VISIT   HPI:  Chief Complaint  Patient presents with  . Eye redness and drainage    bilateral itchy, red eyes with drainage  . Cough    sx started over the weekend  . Nasal Congestion    Larry Leon is a 58 y.o. male, who is here today with his wife complaining of 4 days bilateral conjunctival erythema and drainage. + Pruritus. Negative for visual changes,headache,nasuea,or vomiting.  Denies fever,chills,or body aches. Mild non productive cough and scratchy throat. + Rhinorrhea and nasal congestion. Symptoms worse in the morning. He is not sure about exacerbating or alleviating factors.  Coworkers with respiratory illness. No recent travel.  Denies Hx of seasonal llergies He has tried nasal Afrin.  Review of Systems  Constitutional: Negative for chills, fatigue and fever.  HENT: Positive for congestion, postnasal drip, sinus pressure and sore throat. Negative for facial swelling.   Eyes: Positive for discharge, redness and itching. Negative for photophobia and pain.  Respiratory: Positive for cough. Negative for shortness of breath and wheezing.   Gastrointestinal: Negative for nausea and vomiting.  Musculoskeletal: Negative for arthralgias and joint swelling.  Skin: Negative for rash.  Allergic/Immunologic: Negative for environmental allergies.  Neurological: Negative for weakness and headaches.     Current Outpatient Medications on File Prior to Visit  Medication Sig Dispense Refill  . amLODipine (NORVASC) 5 MG tablet Take 1 tablet (5 mg total) by mouth daily. 90 tablet 3  . Omega-3 Fatty Acids (FISH OIL) 1000 MG CAPS Take by mouth 2 (two) times daily.      Marland Kitchen triamcinolone (KENALOG) 0.1 % ointment apply two times a day to rash behind ear 45 gram      No current facility-administered medications on file prior to visit.      Past Medical History:  Diagnosis Date  . History of chicken pox   . Hypertension    No Known  Allergies  Social History   Socioeconomic History  . Marital status: Married    Spouse name: Not on file  . Number of children: Not on file  . Years of education: Not on file  . Highest education level: Not on file  Occupational History  . Not on file  Social Needs  . Financial resource strain: Not on file  . Food insecurity:    Worry: Not on file    Inability: Not on file  . Transportation needs:    Medical: Not on file    Non-medical: Not on file  Tobacco Use  . Smoking status: Never Smoker  . Smokeless tobacco: Never Used  Substance and Sexual Activity  . Alcohol use: No  . Drug use: No  . Sexual activity: Yes  Lifestyle  . Physical activity:    Days per week: Not on file    Minutes per session: Not on file  . Stress: Not on file  Relationships  . Social connections:    Talks on phone: Not on file    Gets together: Not on file    Attends religious service: Not on file    Active member of club or organization: Not on file    Attends meetings of clubs or organizations: Not on file    Relationship status: Not on file  Other Topics Concern  . Not on file  Social History Narrative   Married - Knute Neu.  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 Enjoys cooking, and avid [employer].  As of 6-11 His stepson and family (2 kids 3 and 60 yo) are living with him       Smoking Status:  Never      Does Patient Exercise:  yes    Vitals:   11/29/18 0935  BP: 130/88  Pulse: 90  Resp: 12  Temp: 98.7 F (37.1 C)  SpO2: 95%   Body mass index is 33.03 kg/m.   Physical Exam  Nursing note and vitals reviewed. Constitutional: He is oriented to person, place, and time. He appears well-developed. He does not appear ill. No distress.  HENT:  Head: Normocephalic and atraumatic.  Nose: Rhinorrhea present. Right sinus exhibits no maxillary sinus tenderness and no frontal sinus tenderness. Left sinus exhibits no maxillary sinus tenderness and no frontal sinus tenderness.   Mouth/Throat: Uvula is midline and mucous membranes are normal.  Hypertrophic turbinates. Postnasal drainage. Nasal voice.  Eyes: Pupils are equal, round, and reactive to light. EOM are normal. Right eye exhibits no exudate. No foreign body present in the right eye. Left eye exhibits no exudate. No foreign body present in the left eye. Right conjunctiva is injected. Right conjunctiva has no hemorrhage. Left conjunctiva is injected. Left conjunctiva has no hemorrhage.  Fundoscopic exam:      The right eye shows no exudate and no hemorrhage.       The left eye shows no exudate and no hemorrhage.  Epiphora bilateral.  Cardiovascular: Normal rate and regular rhythm.  Respiratory: Effort normal and breath sounds normal. No respiratory distress.  Lymphadenopathy:       Head (right side): No preauricular and no posterior auricular adenopathy present.       Head (left side): No preauricular and no posterior auricular adenopathy present.    He has no cervical adenopathy.  Neurological: He is alert and oriented to person, place, and time. He has normal strength.  Skin: Skin is warm. No rash noted. No erythema.  Psychiatric: He has a normal mood and affect.  Well groomed,good eye contact.    ASSESSMENT AND PLAN:  Larry Leon was seen today for eye redness and drainage, cough and nasal congestion.  Diagnoses and all orders for this visit:  Acute conjunctivitis of both eyes, unspecified acute conjunctivitis type We discussed possible etiologies. It seems to be viral etiology. Abx +steroid eye drops to use up to 7 days.Side effects discussed.  -     neomycin-polymyxin-hydrocortisone (CORTISPORIN) 3.5-10000-1 ophthalmic suspension; Place 1 drop into both eyes 3 (three) times daily for 7 days.  URI, acute Symptoms suggests a viral etiology, symptomatic treatment recommended, so I do not think abx is needed at this time. Instructed to monitor for signs of complications, including new onset of  fever among some, instructed about warning signs. I also explained that cough and nasal congestion can last a few days and sometimes weeks. F/U as needed.   Nasal sinus congestion Nasal saline irrigations as needed. Avoid Afrin. Short course of oral prednisone may help. Side effects discussed. Flonase nasal spray after completing abx treatment.  -     predniSONE (DELTASONE) 20 MG tablet; Take 2 tablets (40 mg total) by mouth daily with breakfast for 3 days. -     fluticasone (FLONASE) 50 MCG/ACT nasal spray; Place 1 spray into both nostrils 2 (two) times daily.    Return if symptoms worsen or fail to improve.     Tonnia Bardin G. [place], MD  [facility]. [place] office.

## 2018-11-29 NOTE — Patient Instructions (Addendum)
A few things to remember from today's visit:   Acute conjunctivitis of both eyes, unspecified acute conjunctivitis type - Plan: neomycin-polymyxin-hydrocortisone (CORTISPORIN) 3.5-10000-1 ophthalmic suspension  URI, acute  Nasal sinus congestion - Plan: predniSONE (DELTASONE) 20 MG tablet, fluticasone (FLONASE) 50 MCG/ACT nasal spray  Conjunctivitis could be allergic or viral. Eyedrops to use for up to 7 days. Flonase nasal spray to start after you complete prednisone. Take prednisone with breakfast. Continue Claritin.  Please be sure medication list is accurate. If a new problem present, please set up appointment sooner than planned today.

## 2018-12-02 ENCOUNTER — Encounter: Payer: Self-pay | Admitting: Family Medicine

## 2019-07-24 ENCOUNTER — Other Ambulatory Visit: Payer: Self-pay | Admitting: Family Medicine

## 2019-07-24 DIAGNOSIS — I10 Essential (primary) hypertension: Secondary | ICD-10-CM

## 2020-04-28 ENCOUNTER — Other Ambulatory Visit: Payer: Self-pay | Admitting: Family Medicine

## 2020-04-28 DIAGNOSIS — I10 Essential (primary) hypertension: Secondary | ICD-10-CM

## 2020-07-30 ENCOUNTER — Other Ambulatory Visit: Payer: Self-pay | Admitting: Family Medicine

## 2020-07-30 DIAGNOSIS — I10 Essential (primary) hypertension: Secondary | ICD-10-CM

## 2020-08-08 DIAGNOSIS — H6121 Impacted cerumen, right ear: Secondary | ICD-10-CM | POA: Diagnosis not present

## 2020-08-08 DIAGNOSIS — H60391 Other infective otitis externa, right ear: Secondary | ICD-10-CM | POA: Diagnosis not present

## 2020-10-26 ENCOUNTER — Other Ambulatory Visit: Payer: Self-pay | Admitting: Family Medicine

## 2020-10-26 DIAGNOSIS — I10 Essential (primary) hypertension: Secondary | ICD-10-CM

## 2020-12-18 ENCOUNTER — Encounter: Payer: Self-pay | Admitting: Emergency Medicine

## 2020-12-18 ENCOUNTER — Emergency Department (INDEPENDENT_AMBULATORY_CARE_PROVIDER_SITE_OTHER): Payer: BLUE CROSS/BLUE SHIELD

## 2020-12-18 ENCOUNTER — Other Ambulatory Visit: Payer: Self-pay

## 2020-12-18 ENCOUNTER — Emergency Department (INDEPENDENT_AMBULATORY_CARE_PROVIDER_SITE_OTHER)
Admission: EM | Admit: 2020-12-18 | Discharge: 2020-12-18 | Disposition: A | Discharge status: Home / Self Care | Payer: Worker's Compensation | Source: Home / Self Care

## 2020-12-18 DIAGNOSIS — S42402A Unspecified fracture of lower end of left humerus, initial encounter for closed fracture: Secondary | ICD-10-CM | POA: Diagnosis not present

## 2020-12-18 DIAGNOSIS — M25522 Pain in left elbow: Secondary | ICD-10-CM

## 2020-12-18 DIAGNOSIS — W19XXXA Unspecified fall, initial encounter: Secondary | ICD-10-CM | POA: Diagnosis not present

## 2020-12-18 NOTE — ED Provider Notes (Signed)
[facility]   [id number] 12/18/20 Arrival Time: 1834  UU:[id number] PAIN  SUBJECTIVE: History from: patient. Larry Leon is a 60 y.o. male complains of L elbow pain that began earlier today after a fall onto a concrete floor. Reports that he landed on his L elbow. Describes the pain as intermittent and achy in character. Has not tried OTC medications without relief. Symptoms are made worse with palpation. Denies limited ROM, popping, obvious deformity. Denies similar symptoms in the past. Denies fever, chills, erythema, ecchymosis, effusion, weakness, numbness and tingling, saddle paresthesias, loss of bowel or bladder function.      ROS: As per HPI.  All other pertinent ROS negative.     Past Medical History:  Diagnosis Date  . History of chicken pox   . Hypertension    Past Surgical History:  Procedure Laterality Date  . HERNIA REPAIR  2000   No Known Allergies No current facility-administered medications on file prior to encounter.   Current Outpatient Medications on File Prior to Encounter  Medication Sig Dispense Refill  . amLODipine (NORVASC) 5 MG tablet TAKE 1 TABLET BY MOUTH EVERY DAY 90 tablet 0  . Omega-3 Fatty Acids (FISH OIL) 1000 MG CAPS Take by mouth 2 (two) times daily.       Social History   Socioeconomic History  . Marital status: Married    Spouse name: Not on file  . Number of children: Not on file  . Years of education: Not on file  . Highest education level: Not on file  Occupational History  . Not on file  Tobacco Use  . Smoking status: Never Smoker  . Smokeless tobacco: Never Used  Vaping Use  . Vaping Use: Never used  Substance and Sexual Activity  . Alcohol use: No  . Drug use: No  . Sexual activity: Yes  Other Topics Concern  . Not on file  Social History Narrative   Married - Knute Neu.  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 Enjoys cooking, and avid [employer].        As of 6-11 His stepson and family (2 kids 3 and 45 yo) are  living with him       Smoking Status:  Never      Does Patient Exercise:  yes   Social Determinants of Health   Financial Resource Strain: Not on file  Food Insecurity: Not on file  Transportation Needs: Not on file  Physical Activity: Not on file  Stress: Not on file  Social Connections: Not on file  Intimate Partner Violence: Not on file   Family History  Problem Relation Age of Onset  . Hypertension Mother   . Arthritis Mother   . Hyperlipidemia Father   . Alcohol abuse Brother   . Hypertension Brother   . Alcohol abuse Maternal Grandmother   . Heart attack Maternal Grandmother   . Heart attack Maternal Grandfather   . Cancer Paternal Grandmother   . Stroke Paternal Grandmother   . Heart attack Paternal Grandfather     OBJECTIVE:  Vitals:   12/18/20 1850 12/18/20 1851 12/18/20 1923  BP: (!) 194/114  (!) 166/103  Pulse: 70    Resp: 18    Temp: 98.8 F (37.1 C)    TempSrc: Oral    SpO2: 97%    Weight:  225 lb (102.1 kg)   Height:  6' (1.829 m)     General appearance: ALERT; in no acute distress.  Head: NCAT Lungs: Normal respiratory  effort CV: pulses 2+ bilaterally. Cap refill < 2 seconds Musculoskeletal:  Inspection: Skin warm, dry, clear and intact, mild swelling to L elbow at olecranon process, abrasion present to L elbow, no drainage, no bleeding, no foreign body appreciated No erythema, effusion noted Palpation: L olecranon process tender to palpation ROM: FROM active and passive Skin: warm and dry Neurologic: Ambulates without difficulty; Sensation intact about the upper/ lower extremities Psychological: alert and cooperative; normal mood and affect  DIAGNOSTIC STUDIES:  DG Elbow Complete Left  Result Date: 12/18/2020 CLINICAL DATA:  Pain following fall EXAM: LEFT ELBOW - COMPLETE 3+ VIEW COMPARISON:  None. FINDINGS: Frontal, lateral, and bilateral oblique views were obtained. There is an apparent accessory ossicle lateral to the medial distal  humeral condyle. A subtle lucency in this area could represent a nondisplaced fracture in this accessory ossicle. No other evident potential fracture. No dislocation. No joint effusion. No joint space narrowing or erosion. IMPRESSION: Subtle lucency in an accessory ossicle adjacent to the medial aspect of the distal humeral condyle. Question nondisplaced fracture in this area. No other findings suggesting potential fracture. No dislocation. No appreciable underlying arthropathic change. These results will be called to the ordering clinician or representative by the Radiologist Assistant, and communication documented in the PACS or [employer]. Electronically Signed   By: Bretta Bang III M.D.   On: 12/18/2020 19:19     ASSESSMENT & PLAN:  1. Elbow fracture, left, closed, initial encounter   2. Fall, initial encounter   3. Left elbow pain      Xray today "shows lucency in an accessory ossicle adjacent to the medial aspect of the distal humeral condyle" Continue conservative management of rest, ice, and gentle stretches Take ibuprofen as needed for pain relief (may cause abdominal discomfort, ulcers, and GI bleeds avoid taking with other NSAIDs) Follow up with orthopedics Return or go to the ER if you have any new or worsening symptoms (fever, chills, chest pain, abdominal pain, changes in bowel or bladder habits, pain radiating into lower legs)   Reviewed expectations re: course of current medical issues. Questions answered. Outlined signs and symptoms indicating need for more acute intervention. Patient verbalized understanding. After Visit Summary given.       Moshe Cipro, NP 12/18/20 1939

## 2020-12-18 NOTE — Discharge Instructions (Addendum)
Xray today shows stable L elbow fracture  May take ibuprofen or tylenol for pain  May use ice to the area as needed  Follow up with this office or with primary care if symptoms are persisting.  Follow up in the ER for high fever, trouble swallowing, trouble breathing, other concerning symptoms.

## 2020-12-18 NOTE — ED Triage Notes (Signed)
Larey Seat today hurting LT elbow and left back. Denies back pain.

## 2020-12-19 ENCOUNTER — Ambulatory Visit: Payer: BLUE CROSS/BLUE SHIELD | Admitting: Adult Health

## 2020-12-19 ENCOUNTER — Encounter: Payer: Self-pay | Admitting: Adult Health

## 2020-12-19 ENCOUNTER — Ambulatory Visit (INDEPENDENT_AMBULATORY_CARE_PROVIDER_SITE_OTHER): Payer: Worker's Compensation

## 2020-12-19 VITALS — BP 140/82 | HR 65 | Temp 97.7°F | Wt 234.2 lb

## 2020-12-19 DIAGNOSIS — M7989 Other specified soft tissue disorders: Secondary | ICD-10-CM

## 2020-12-19 DIAGNOSIS — M25532 Pain in left wrist: Secondary | ICD-10-CM | POA: Diagnosis not present

## 2020-12-19 DIAGNOSIS — M19042 Primary osteoarthritis, left hand: Secondary | ICD-10-CM | POA: Diagnosis not present

## 2020-12-19 DIAGNOSIS — R600 Localized edema: Secondary | ICD-10-CM | POA: Diagnosis not present

## 2020-12-19 DIAGNOSIS — S42402A Unspecified fracture of lower end of left humerus, initial encounter for closed fracture: Secondary | ICD-10-CM

## 2020-12-19 NOTE — Patient Instructions (Signed)
It was great seeing you today   I am going to xray the left hand and left wrist today and will follow up with you once I get the results back   I have also referred you to orthopedics - they will call you to schedule your appointment   Continue to rest, ice, and can take motrin as needed

## 2020-12-19 NOTE — Progress Notes (Signed)
Subjective:    Patient ID: Larry Leon, male    DOB: March 08, 1961, 60 y.o.   MRN: [id number]  HPI 60 year old male who  has a past medical history of History of chicken pox and Hypertension.  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  He reports that he landed on his left elbow.  X-ray showed lucency in the accessory ossicle silently adjacent to the medial aspect of the distal humeral condyle.  He was advised conservative management of rest, ice, gentle stretching, and to follow-up with his PCP as well as orthopedics.  Today he reports that he woke up last night and his left hand was swollen.  The swelling has improved continues to be present.  He does not remember landing on his left hand when he fell but is not completely sure.  Denies grip strength.  Has no pain in his left hand or wrist, has full range of motion.  No numbness or tingling sensation.   Review of Systems See HPI   Past Medical History:  Diagnosis Date  . History of chicken pox   . Hypertension     Social History   Socioeconomic History  . Marital status: Married    Spouse name: Not on file  . Number of children: Not on file  . Years of education: Not on file  . Highest education level: Not on file  Occupational History  . Not on file  Tobacco Use  . Smoking status: Never Smoker  . Smokeless tobacco: Never Used  Vaping Use  . Vaping Use: Never used  Substance and Sexual Activity  . Alcohol use: No  . Drug use: No  . Sexual activity: Yes  Other Topics Concern  . Not on file  Social History Narrative   Married - Knute Neu.  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 Enjoys cooking, and avid [employer].        As of 6-11 His stepson and family (2 kids 3 and 88 yo) are living with him       Smoking Status:  Never      Does Patient Exercise:  yes   Social Determinants of Health    Financial Resource Strain: Not on file  Food Insecurity: Not on file  Transportation Needs: Not on file  Physical Activity: Not on file  Stress: Not on file  Social Connections: Not on file  Intimate Partner Violence: Not on file    Past Surgical History:  Procedure Laterality Date  . HERNIA REPAIR  2000    Family History  Problem Relation Age of Onset  . Hypertension Mother   . Arthritis Mother   . Hyperlipidemia Father   . Alcohol abuse Brother   . Hypertension Brother   . Alcohol abuse Maternal Grandmother   . Heart attack Maternal Grandmother   . Heart attack Maternal Grandfather   . Cancer Paternal Grandmother   . Stroke Paternal Grandmother   . Heart attack Paternal Grandfather     No Known Allergies  Current Outpatient Medications on File Prior to Visit  Medication Sig Dispense Refill  . amLODipine (NORVASC) 5 MG tablet TAKE 1 TABLET BY MOUTH EVERY DAY 90 tablet 0  . Omega-3 Fatty Acids (FISH OIL) 1000 MG CAPS Take by mouth 2 (two) times daily.     No current facility-administered medications on file prior to  visit.    BP 140/82 (BP Location: Left Arm, Patient Position: Sitting, Cuff Size: Normal)   Pulse 65   Temp 97.7 F (36.5 C) (Oral)   Wt 234 lb 3.2 oz (106.2 kg)   SpO2 98%   BMI 31.76 kg/m       Objective:   Physical Exam Vitals and nursing note reviewed.  Constitutional:      Appearance: Normal appearance.  Cardiovascular:     Pulses:          Radial pulses are 2+ on the right side and 2+ on the left side.  Musculoskeletal:        General: Swelling (Mild edema noted to the dorsum of his left hand extending into all fingers of the left hand.) present. No tenderness.  Skin:    General: Skin is warm and dry.     Capillary Refill: Capillary refill takes less than 2 seconds.  Neurological:     General: No focal deficit present.     Mental Status: He is alert. Mental status is at baseline. He is disoriented.     Sensory: Sensation is  intact.     Comments: No decreased grip strenght    Psychiatric:        Mood and Affect: Mood normal.        Behavior: Behavior normal.        Thought Content: Thought content normal.        Judgment: Judgment normal.        Assessment & Plan:  1. Swelling of left hand -Cap refill is within normal limits, 2 radial pulses.  No vascular compromise noted.  No signs of cellulitis.  Does have soft tissue swelling throughout the dorsum of his left hand and wrist.  We will check x-rays doubt fracture. -Red flags reviewed - DG Hand Complete Left; Future - DG Wrist Complete Left; Future  2. Closed fracture of left elbow, initial encounter - Conservative measures until see by orthopedics  - AMB referral to orthopedics   Shirline Frees, NP

## 2020-12-25 ENCOUNTER — Encounter: Payer: Self-pay | Admitting: Orthopaedic Surgery

## 2020-12-25 ENCOUNTER — Ambulatory Visit (INDEPENDENT_AMBULATORY_CARE_PROVIDER_SITE_OTHER): Payer: Worker's Compensation | Admitting: Orthopaedic Surgery

## 2020-12-25 DIAGNOSIS — M25522 Pain in left elbow: Secondary | ICD-10-CM

## 2020-12-25 MED ORDER — DICLOFENAC SODIUM 75 MG PO TBEC: 75.0000 mg | DELAYED_RELEASE_TABLET | Freq: Two times a day (BID) | ORAL | 0 refills | Status: AC | PRN

## 2020-12-25 NOTE — Progress Notes (Signed)
Office Visit Note   Patient: Larry Leon           Date of Birth: April 15, 1961           MRN: [id number] Visit Date: 12/25/2020              Requested by: Shirline Frees, NP [address] [place],  [place] [address] PCP: [place], Betty G, MD   Assessment & Plan: Visit Diagnoses:  1. Pain in left elbow     Plan: Impression is questionable nondisplaced fracture through the medial epicondyle.  We will treat this symptomatically.  He will rest and ice is much as possible.  I will call in an anti-inflammatory to take as needed.  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  Follow-up with [place] in 4 weeks time for repeat evaluation and three-view x-rays of the left elbow.  Call with concerns or questions in the meantime.  Follow-Up Instructions: Return in about 4 weeks (around 01/22/2021).   Orders:  No orders of the defined types were placed in this encounter.  No orders of the defined types were placed in this encounter.     Procedures: No procedures performed   Clinical Data: No additional findings.   Subjective: Chief Complaint  Patient presents with  . 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  2422, he was coming down the stairs of the truck when he missed a step falling on his left elbow.  He was seen in urgent care setting that night where x-rays were obtained.  X-rays demonstrated possible nondisplaced fracture to the medial epicondyle.  He comes in today for further evaluation treatment recommendation.  The pain he has is primarily to the posterior elbow.  He describes this as a constant dull ache worse with supination of the forearm.  He has been taking Aleve which does help.  He denies any paresthesias.  No weakness.  Review of Systems  Constitutional: Negative.   All other systems reviewed and are negative.  HPI.  All other  reviewed and are negative.   Objective: Vital Signs: There were no vitals taken for this visit.  Physical Exam Vitals and nursing note reviewed.  Constitutional:      Appearance: He is well-developed.  HENT:     Head: Normocephalic and atraumatic.  Eyes:     Pupils: Pupils are equal, round, and reactive to light.  Pulmonary:     Effort: Pulmonary effort is normal.  Abdominal:     Palpations: Abdomen is soft.  Musculoskeletal:        General: Normal range of motion.     Cervical back: Neck supple.  Skin:    General: Skin is warm.  Neurological:     Mental Status: He is alert and oriented to person, place, and time.  Psychiatric:        Behavior: Behavior normal.        Thought Content: Thought content normal.        Judgment: Judgment normal.    well-developed well-nourished gentleman in no acute distress.  Alert oriented x3.  Ortho Exam left elbow exam shows no swelling.  He has full flexion, extension, supination and pronation although he does have slight pain with supination.  He has point tenderness to the olecranon.  No tenderness to the medial epicondyle.  Near full strength.  He is neurovascular intact distally.  Specialty Comments:  No specialty comments available.  Imaging: No new imaging   PMFS History: Patient Active Problem List   Diagnosis Date Noted  . Hypercalcemia 08/28/2018  . Dermatitis, seborrheic 08/28/2018  . Class 1 obesity with body mass index (BMI) of 32.0 to 32.9 in adult 08/28/2018  . History of colonic polyps 06/22/2016  . Changing skin lesion 10/21/2011  . Cerumen impaction 03/01/2011  . ACTINIC KERATOSIS 03/23/2010  . SEBORRHEA 03/23/2010  . Essential hypertension 05/04/2007   Past Medical History:  Diagnosis Date  . History of chicken pox   . Hypertension     Family History  Problem Relation Age of Onset  . Hypertension Mother   . Arthritis Mother   . Hyperlipidemia Father   . Alcohol abuse Brother   . Hypertension Brother    . Alcohol abuse Maternal Grandmother   . Heart attack Maternal Grandmother   . Heart attack Maternal Grandfather   . Cancer Paternal Grandmother   . Stroke Paternal Grandmother   . Heart attack Paternal Grandfather     Past Surgical History:  Procedure Laterality Date  . HERNIA REPAIR  2000   Social History   Occupational History  . Not on file  Tobacco Use  . Smoking status: Never Smoker  . Smokeless tobacco: Never Used  Vaping Use  . Vaping Use: Never used  Substance and Sexual Activity  . Alcohol use: No  . Drug use: No  . Sexual activity: Yes

## 2021-01-22 ENCOUNTER — Ambulatory Visit: Payer: BLUE CROSS/BLUE SHIELD | Admitting: Orthopaedic Surgery

## 2021-01-23 ENCOUNTER — Encounter: Payer: Self-pay | Admitting: Orthopaedic Surgery

## 2021-01-23 ENCOUNTER — Ambulatory Visit: Payer: Self-pay

## 2021-01-23 ENCOUNTER — Ambulatory Visit (INDEPENDENT_AMBULATORY_CARE_PROVIDER_SITE_OTHER): Payer: Worker's Compensation | Admitting: Orthopaedic Surgery

## 2021-01-23 DIAGNOSIS — M25522 Pain in left elbow: Secondary | ICD-10-CM | POA: Diagnosis not present

## 2021-01-23 NOTE — Progress Notes (Signed)
Office Visit Note   Patient: Larry Leon           Date of Birth: 06-09-1961           MRN: [id number] Visit Date: 01/23/2021              Requested by: [place], Betty G, MD [address] [place],  [place] [address] PCP: [place], Betty G, MD   Assessment & Plan: Visit Diagnoses:  1. Pain in left elbow     Plan: Based on my assessment Mr. 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  Light duty is not available.  He will need to remain out of work until he completes physical therapy and work conditioning.  We will see him back in 6 weeks.  Follow-Up Instructions: Return in about 6 weeks (around 03/06/2021).   Orders:  Orders Placed This Encounter  Procedures  . XR Elbow Complete Left (3+View)  . Ambulatory referral to Physical Therapy   No orders of the defined types were placed in this encounter.     Procedures: No procedures performed   Clinical Data: No additional findings.   Subjective: Chief Complaint  Patient presents with  . Left Elbow - Pain    Mr. Eskew returns today for follow-up of his left elbow pain.  He is doing better with occasional pain and takes Aleve every other day.  He has been out of work since the injury.  He reports weakness with function of his left upper extremity due to disuse.   Review of Systems  Constitutional: Negative.   All other systems reviewed and are negative.    Objective: Vital Signs: There were no vitals taken for this visit.  Physical Exam Vitals and nursing note reviewed.  Constitutional:      Appearance: He is well-developed.  Pulmonary:     Effort: Pulmonary effort is normal.  Abdominal:     Palpations: Abdomen is soft.  Skin:    General: Skin is warm.  Neurological:     Mental Status: He is alert and oriented to person, place, and time.  Psychiatric:        Behavior: Behavior normal.         Thought Content: Thought content normal.        Judgment: Judgment normal.     Ortho Exam Left elbow shows full range of motion without pain.  He has a slight tenderness over the olecranon bursa and triceps tendon attachment.  Strength is 4/5 with elbow extension and flexion.  There is no swelling. Specialty Comments:  No specialty comments available.  Imaging: XR Elbow Complete Left (3+View)  Result Date: 01/23/2021 No acute or structural abnormalities.  Small ossicle near the medial epicondyle which is unchanged and likely a reflection of chronic tendinosis.    PMFS History: Patient Active Problem List   Diagnosis Date Noted  . Hypercalcemia 08/28/2018  . Dermatitis, seborrheic 08/28/2018  . Class 1 obesity with body mass index (BMI) of 32.0 to 32.9 in adult 08/28/2018  . History of colonic polyps 06/22/2016  . Changing skin lesion 10/21/2011  . Cerumen impaction 03/01/2011  . ACTINIC KERATOSIS 03/23/2010  . SEBORRHEA 03/23/2010  . Essential hypertension 05/04/2007   Past Medical History:  Diagnosis Date  . History of chicken pox   . Hypertension     Family History  Problem Relation Age  of Onset  . Hypertension Mother   . Arthritis Mother   . Hyperlipidemia Father   . Alcohol abuse Brother   . Hypertension Brother   . Alcohol abuse Maternal Grandmother   . Heart attack Maternal Grandmother   . Heart attack Maternal Grandfather   . Cancer Paternal Grandmother   . Stroke Paternal Grandmother   . Heart attack Paternal Grandfather     Past Surgical History:  Procedure Laterality Date  . HERNIA REPAIR  2000   Social History   Occupational History  . Not on file  Tobacco Use  . Smoking status: Never Smoker  . Smokeless tobacco: Never Used  Vaping Use  . Vaping Use: Never used  Substance and Sexual Activity  . Alcohol use: No  . Drug use: No  . Sexual activity: Yes

## 2021-02-13 ENCOUNTER — Other Ambulatory Visit: Payer: Self-pay | Admitting: Family Medicine

## 2021-02-13 DIAGNOSIS — I10 Essential (primary) hypertension: Secondary | ICD-10-CM

## 2021-02-16 MED ORDER — AMLODIPINE BESYLATE 5 MG PO TABS: 1.0000 | ORAL_TABLET | Freq: Every day | ORAL | 0 refills | Status: DC

## 2021-02-25 ENCOUNTER — Ambulatory Visit: Payer: BLUE CROSS/BLUE SHIELD | Admitting: Family Medicine

## 2021-02-25 ENCOUNTER — Encounter: Payer: Self-pay | Admitting: Family Medicine

## 2021-02-25 ENCOUNTER — Other Ambulatory Visit: Payer: Self-pay

## 2021-02-25 VITALS — BP 132/80 | HR 88 | Resp 16 | Ht 72.0 in | Wt 236.5 lb

## 2021-02-25 DIAGNOSIS — Z1159 Encounter for screening for other viral diseases: Secondary | ICD-10-CM

## 2021-02-25 DIAGNOSIS — E785 Hyperlipidemia, unspecified: Secondary | ICD-10-CM

## 2021-02-25 DIAGNOSIS — Z6832 Body mass index (BMI) 32.0-32.9, adult: Secondary | ICD-10-CM

## 2021-02-25 DIAGNOSIS — I10 Essential (primary) hypertension: Secondary | ICD-10-CM

## 2021-02-25 DIAGNOSIS — E669 Obesity, unspecified: Secondary | ICD-10-CM | POA: Diagnosis not present

## 2021-02-25 DIAGNOSIS — G4700 Insomnia, unspecified: Secondary | ICD-10-CM | POA: Diagnosis not present

## 2021-02-25 DIAGNOSIS — E559 Vitamin D deficiency, unspecified: Secondary | ICD-10-CM

## 2021-02-25 LAB — VITAMIN D 25 HYDROXY (VIT D DEFICIENCY, FRACTURES): VITD: 44.33 ng/mL (ref 30.00–100.00)

## 2021-02-25 MED ORDER — AMLODIPINE BESYLATE 5 MG PO TABS: 1.0000 | ORAL_TABLET | Freq: Every day | ORAL | 3 refills | Status: DC

## 2021-02-25 MED ORDER — DOXEPIN HCL 10 MG/ML PO CONC: 10.0000 mg | Freq: Every day | ORAL | 2 refills | Status: DC

## 2021-02-25 NOTE — Progress Notes (Signed)
HPI: Larry Leon is a 60 y.o. male, who is here today for follow up.   He was last seen on 11/29/18. HTN on Amlodipine 5 mg daily. BP readings at home with wrist BP monitor most of the time 130's/80's, a few 140's/90's. Negative for severe/frequent headache, visual changes, chest pain, dyspnea, palpitation, claudication, focal weakness, or edema.  Lab Results  Component Value Date   CREATININE 0.82 08/28/2018   BUN 10 08/28/2018   NA 136 08/28/2018   K 3.8 08/28/2018   CL 98 08/28/2018   CO2 28 08/28/2018   Last eye exam in 04/2020.  He is exercising regularly, walking in average 1.5 hours a few times per week since he has been home recovering from work related injury. He is trying to follow a healthful diet.  Fell at work, left elbow fracture. Recovering well, doing PT and following with ortho.  Insomnia: For the past 2 years he has had trouble staying asleep. Wakes up around 2-3 am and takes him about 1-2 hours to go back to sleep. Melatonin did not help. Problem happens almost daily. He has not identified exacerbating factors.  Vit D deficiency: He is on vit D supplementation, not sure about dose, 50-100 mcg daily. Last 25 OH vit D was 21 in 08/2018.  Review of Systems  Constitutional: Positive for fatigue. Negative for activity change, appetite change and fever.  HENT: Negative for nosebleeds and sore throat.   Respiratory: Negative for cough and wheezing.   Gastrointestinal: Negative for abdominal pain, nausea and vomiting.  Genitourinary: Negative for decreased urine volume, dysuria and hematuria.  Musculoskeletal: Negative for gait problem and myalgias.  Skin: Negative for pallor and rash.  Neurological: Negative for syncope, facial asymmetry and weakness.  Rest of ROS, see pertinent positives sand negatives in HPI  Current Outpatient Medications on File Prior to Visit  Medication Sig Dispense Refill  . diclofenac (VOLTAREN) 75 MG EC tablet Take  1 tablet (75 mg total) by mouth 2 (two) times daily as needed. 60 tablet 0  . Omega-3 Fatty Acids (FISH OIL) 1000 MG CAPS Take by mouth 2 (two) times daily.     No current facility-administered medications on file prior to visit.   Past Medical History:  Diagnosis Date  . History of chicken pox   . Hypertension    No Known Allergies  Social History   Socioeconomic History  . Marital status: Married    Spouse name: Not on file  . Number of children: Not on file  . Years of education: Not on file  . Highest education level: Not on file  Occupational History  . Not on file  Tobacco Use  . Smoking status: Never Smoker  . Smokeless tobacco: Never Used  Vaping Use  . Vaping Use: Never used  Substance and Sexual Activity  . Alcohol use: No  . Drug use: No  . Sexual activity: Yes  Other Topics Concern  . Not on file  Social History Narrative   Married - Larry Leon.  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 Enjoys cooking, and avid [employer].        As of 6-11 His stepson and family (2 kids 3 and 73 yo) are living with him       Smoking Status:  Never      Does Patient Exercise:  yes   Social Determinants of Health   Financial Resource Strain: Not on file  Food Insecurity: Not on file  Transportation  Needs: Not on file  Physical Activity: Not on file  Stress: Not on file  Social Connections: Not on file   Vitals:   02/25/21 1127  BP: 132/80  Pulse: 88  Resp: 16  SpO2: 97%   Wt Readings from Last 3 Encounters:  02/25/21 236 lb 8 oz (107.3 kg)  12/19/20 234 lb 3.2 oz (106.2 kg)  12/18/20 225 lb (102.1 kg)   Body mass index is 32.08 kg/m.  Physical Exam Vitals and nursing note reviewed.  Constitutional:      General: He is not in acute distress.    Appearance: He is well-developed.  HENT:     Head: Normocephalic and atraumatic.     Mouth/Throat:     Mouth: Mucous membranes are moist.     Pharynx: Oropharynx is clear.  Eyes:     Conjunctiva/sclera: Conjunctivae  normal.  Cardiovascular:     Rate and Rhythm: Normal rate and regular rhythm.     Pulses:          Dorsalis pedis pulses are 2+ on the right side and 2+ on the left side.     Heart sounds: No murmur heard.   Pulmonary:     Effort: Pulmonary effort is normal. No respiratory distress.     Breath sounds: Normal breath sounds.  Abdominal:     Palpations: Abdomen is soft. There is no hepatomegaly or mass.     Tenderness: There is no abdominal tenderness.  Lymphadenopathy:     Cervical: No cervical adenopathy.  Skin:    General: Skin is warm.     Findings: No erythema or rash.  Neurological:     Mental Status: He is alert and oriented to person, place, and time.     Cranial Nerves: No cranial nerve deficit.     Gait: Gait normal.  Psychiatric:     Comments: Well groomed, good eye contact.   ASSESSMENT AND PLAN:  Mr. Larry Leon was seen today for follow-up.  Orders Placed This Encounter  Procedures  . Comprehensive metabolic panel  . Hepatitis C antibody  . VITAMIN D 25 Hydroxy (Vit-D Deficiency, Fractures)   Lab Results  Component Value Date   CREATININE 0.94 02/25/2021   BUN 12 02/25/2021   NA 140 02/25/2021   K 4.3 02/25/2021   CL 100 02/25/2021   CO2 21 02/25/2021   Lab Results  Component Value Date   ALT 32 02/25/2021   AST 20 02/25/2021   ALKPHOS 120 (H) 02/25/2021   BILITOT 0.3 02/25/2021   Insomnia, unspecified type We discussed a few pharmacologic treatment options, he agrees with trying Doxepin, stating with 3 mg and titrating to 6 mg and 10 mg if needed.Some side effects reviewed. Good sleep hygiene. He can try Melatonin again but with empty stomach and 2 hours before bedtime.  -     doxepin (SINEQUAN) 10 MG/ML solution; Take 1 mL (10 mg total) by mouth at bedtime.  Essential hypertension BP adequate controlled today, he has had a few elevated BP's at home.  For now continue Amlodipine 5 mg daily and low salt diet. Continue monitoring BP  regularly and instructed to bring BP monitor next visit.  -     amLODipine (NORVASC) 5 MG tablet; Take 1 tablet (5 mg total) by mouth daily. Appointment needed for further refills.  Class 1 obesity with serious comorbidity and body mass index (BMI) of 32.0 to 32.9 in adult, unspecified obesity type Wt has been stable. Encouraged consistency with  healthy diet and physical activity.  Encounter for HCV screening test for low risk patient -     Hepatitis C antibody  Vitamin D deficiency, unspecified Continue current dose of vit D supplementation. Further recommendations according to 25 OH vit D result.   Return in about 4 months (around 06/27/2021) for cpe.  Shantel Wesely G. [place], MD  [facility]. [place] office.   A few things to remember from today's visit:   Essential hypertension - Plan: Comprehensive metabolic panel, amLODipine (NORVASC) 5 MG tablet  Class 1 obesity with serious comorbidity and body mass index (BMI) of 32.0 to 32.9 in adult, unspecified obesity type  Encounter for HCV screening test for low risk patient - Plan: Hepatitis C antibody  Vitamin D deficiency, unspecified - Plan: VITAMIN D 25 Hydroxy (Vit-D Deficiency, Fractures)  Insomnia, unspecified type  If you need refills please call your pharmacy. Do not use My Chart to request refills or for acute issues that need immediate attention.   Continue monitoring blood pressure, goal is under 140/90 ideally 130/80. Bring monitor next visit. Today Doxepin started 3 mg (0.3 ml) at bedtime, can be increased to 6 mg (0.6 ml) and 10 mg (1 ml) if needed.   Please be sure medication list is accurate. If a new problem present, please set up appointment sooner than planned today.

## 2021-02-25 NOTE — Patient Instructions (Addendum)
A few things to remember from today's visit:   Essential hypertension - Plan: Comprehensive metabolic panel, amLODipine (NORVASC) 5 MG tablet  Class 1 obesity with serious comorbidity and body mass index (BMI) of 32.0 to 32.9 in adult, unspecified obesity type  Encounter for HCV screening test for low risk patient - Plan: Hepatitis C antibody  Vitamin D deficiency, unspecified - Plan: VITAMIN D 25 Hydroxy (Vit-D Deficiency, Fractures)  Insomnia, unspecified type  If you need refills please call your pharmacy. Do not use My Chart to request refills or for acute issues that need immediate attention.   Continue monitoring blood pressure, goal is under 140/90 ideally 130/80. Bring monitor next visit. Today Doxepin started 3 mg (0.3 ml) at bedtime, can be increased to 6 mg (0.6 ml) and 10 mg (1 ml) if needed.   Please be sure medication list is accurate. If a new problem present, please set up appointment sooner than planned today.

## 2021-02-26 LAB — HEPATITIS C ANTIBODY
Hepatitis C Ab: NONREACTIVE
SIGNAL TO CUT-OFF: 0 (ref <1.00)

## 2021-02-27 LAB — COMPREHENSIVE METABOLIC PANEL
ALT: 32 U/L (ref 0–53)
AST: 20 U/L (ref 0–37)
Albumin: 4.7 g/dL (ref 3.5–5.2)
Alkaline Phosphatase: 120 U/L — ABNORMAL HIGH (ref 39–117)
BUN: 12 mg/dL (ref 6–23)
CO2: 21 mEq/L (ref 19–32)
Calcium: 11.2 mg/dL — ABNORMAL HIGH (ref 8.4–10.5)
Chloride: 100 mEq/L (ref 96–112)
Creatinine, Ser: 0.94 mg/dL (ref 0.40–1.50)
GFR: 89.63 mL/min (ref >60.00)
Glucose, Bld: 173 mg/dL — ABNORMAL HIGH (ref 70–99)
Potassium: 4.3 mEq/L (ref 3.5–5.1)
Sodium: 140 mEq/L (ref 135–145)
Total Bilirubin: 0.3 mg/dL (ref 0.2–1.2)
Total Protein: 7.4 g/dL (ref 6.0–8.3)

## 2021-03-02 ENCOUNTER — Encounter: Payer: Self-pay | Admitting: Family Medicine

## 2021-03-02 ENCOUNTER — Other Ambulatory Visit: Payer: Self-pay

## 2021-03-02 DIAGNOSIS — R7309 Other abnormal glucose: Secondary | ICD-10-CM

## 2021-03-06 ENCOUNTER — Other Ambulatory Visit: Payer: Self-pay

## 2021-03-06 ENCOUNTER — Encounter: Payer: Self-pay | Admitting: Orthopaedic Surgery

## 2021-03-06 ENCOUNTER — Ambulatory Visit (INDEPENDENT_AMBULATORY_CARE_PROVIDER_SITE_OTHER): Payer: Worker's Compensation | Admitting: Orthopaedic Surgery

## 2021-03-06 DIAGNOSIS — M25522 Pain in left elbow: Secondary | ICD-10-CM

## 2021-03-06 NOTE — Progress Notes (Signed)
   Office Visit Note   Patient: Larry Leon           Date of Birth: [birth year]           MRN: [id number] Visit Date: 03/06/2021              Requested by: [place], Betty G, MD [id number] [place],  [place] [address] PCP: [place], Betty G, MD   Assessment & Plan: Visit Diagnoses:  1. Pain in left elbow     Plan: From my standpoint he has done very well from the work conditioning and is ready to return back to work without restrictions.  Work note provided today.  We will see him back as needed.  He has no permanent impairment  Follow-Up Instructions: Return if symptoms worsen or fail to improve.   Orders:  No orders of the defined types were placed in this encounter.  No orders of the defined types were placed in this encounter.     Procedures: No procedures performed   Clinical Data: No additional findings.   Subjective: Chief Complaint  Patient presents with   Left Elbow - Pain, Follow-up    Larry Leon returns today for follow-up of left elbow injury.  He has done work conditioning for about 3 weeks and he feels much stronger and much more confident returning back to his job.  He has some 3 out of 10 discomfort after work conditioning.   Review of Systems   Objective: Vital Signs: There were no vitals taken for this visit.  Physical Exam  Ortho Exam Left elbow shows slight tenderness to the olecranon.  Otherwise exam is unremarkable.  He has adequate strength. Specialty Comments:  No specialty comments available.  Imaging: No results found.   PMFS History: Patient Active Problem List   Diagnosis Date Noted   Hypercalcemia 08/28/2018   Dermatitis, seborrheic 08/28/2018   Class 1 obesity with body mass index (BMI) of 32.0 to 32.9 in adult 08/28/2018   History of colonic polyps 06/22/2016   Changing skin lesion 10/21/2011   Cerumen impaction 03/01/2011   ACTINIC KERATOSIS 03/23/2010   SEBORRHEA 03/23/2010   Essential hypertension  05/04/2007   Past Medical History:  Diagnosis Date   History of chicken pox    Hypertension     Family History  Problem Relation Age of Onset   Hypertension Mother    Arthritis Mother    Hyperlipidemia Father    Alcohol abuse Brother    Hypertension Brother    Alcohol abuse Maternal Grandmother    Heart attack Maternal Grandmother    Heart attack Maternal Grandfather    Cancer Paternal Grandmother    Stroke Paternal Grandmother    Heart attack Paternal Grandfather     Past Surgical History:  Procedure Laterality Date   HERNIA REPAIR  2000   Social History   Occupational History   Not on file  Tobacco Use   Smoking status: Never   Smokeless tobacco: Never  Vaping Use   Vaping Use: Never used  Substance and Sexual Activity   Alcohol use: No   Drug use: No   Sexual activity: Yes

## 2021-03-12 ENCOUNTER — Other Ambulatory Visit: Payer: Self-pay

## 2021-03-13 ENCOUNTER — Other Ambulatory Visit (INDEPENDENT_AMBULATORY_CARE_PROVIDER_SITE_OTHER): Payer: BLUE CROSS/BLUE SHIELD

## 2021-03-13 ENCOUNTER — Telehealth: Payer: Self-pay | Admitting: Family Medicine

## 2021-03-13 DIAGNOSIS — R7309 Other abnormal glucose: Secondary | ICD-10-CM

## 2021-03-13 LAB — HEMOGLOBIN A1C: Hgb A1c MFr Bld: 8.8 % — ABNORMAL HIGH (ref 4.6–6.5)

## 2021-03-13 NOTE — Telephone Encounter (Signed)
Patient had lab appointment today and would like a copy of lab results to be printed out for his records. He will pickup results from office. He is not able to print from MyChart.   Patient would like a call when lab work is back and ready for pickup.  Please advise.

## 2021-03-14 ENCOUNTER — Encounter: Payer: Self-pay | Admitting: Family Medicine

## 2021-03-16 NOTE — Telephone Encounter (Signed)
Noted, will notify pt once PCP reviews results.

## 2021-03-20 ENCOUNTER — Encounter: Payer: Self-pay | Admitting: Family Medicine

## 2021-06-10 DIAGNOSIS — H2513 Age-related nuclear cataract, bilateral: Secondary | ICD-10-CM | POA: Diagnosis not present

## 2022-01-28 ENCOUNTER — Other Ambulatory Visit: Payer: Self-pay | Admitting: Family Medicine

## 2022-01-28 DIAGNOSIS — I10 Essential (primary) hypertension: Secondary | ICD-10-CM

## 2022-05-14 DIAGNOSIS — Z0000 Encounter for general adult medical examination without abnormal findings: Secondary | ICD-10-CM | POA: Diagnosis not present

## 2022-05-14 DIAGNOSIS — Z125 Encounter for screening for malignant neoplasm of prostate: Secondary | ICD-10-CM | POA: Diagnosis not present

## 2022-05-14 DIAGNOSIS — Z1211 Encounter for screening for malignant neoplasm of colon: Secondary | ICD-10-CM | POA: Diagnosis not present

## 2022-05-14 DIAGNOSIS — Z7689 Persons encountering health services in other specified circumstances: Secondary | ICD-10-CM | POA: Diagnosis not present

## 2022-05-14 DIAGNOSIS — I10 Essential (primary) hypertension: Secondary | ICD-10-CM | POA: Diagnosis not present

## 2022-06-01 DIAGNOSIS — Z23 Encounter for immunization: Secondary | ICD-10-CM | POA: Diagnosis not present

## 2022-06-01 DIAGNOSIS — I10 Essential (primary) hypertension: Secondary | ICD-10-CM | POA: Diagnosis not present

## 2022-06-22 DIAGNOSIS — Z6830 Body mass index (BMI) 30.0-30.9, adult: Secondary | ICD-10-CM | POA: Diagnosis not present

## 2022-06-22 DIAGNOSIS — Z0000 Encounter for general adult medical examination without abnormal findings: Secondary | ICD-10-CM | POA: Diagnosis not present

## 2022-06-22 DIAGNOSIS — E6609 Other obesity due to excess calories: Secondary | ICD-10-CM | POA: Diagnosis not present

## 2022-06-22 DIAGNOSIS — Z125 Encounter for screening for malignant neoplasm of prostate: Secondary | ICD-10-CM | POA: Diagnosis not present

## 2022-06-22 DIAGNOSIS — I10 Essential (primary) hypertension: Secondary | ICD-10-CM | POA: Diagnosis not present

## 2022-06-24 DIAGNOSIS — D122 Benign neoplasm of ascending colon: Secondary | ICD-10-CM | POA: Diagnosis not present

## 2022-06-24 DIAGNOSIS — Z86010 Personal history of colonic polyps: Secondary | ICD-10-CM | POA: Diagnosis not present

## 2022-06-24 DIAGNOSIS — K649 Unspecified hemorrhoids: Secondary | ICD-10-CM | POA: Diagnosis not present

## 2022-06-24 DIAGNOSIS — Z09 Encounter for follow-up examination after completed treatment for conditions other than malignant neoplasm: Secondary | ICD-10-CM | POA: Diagnosis not present

## 2022-06-24 DIAGNOSIS — K5730 Diverticulosis of large intestine without perforation or abscess without bleeding: Secondary | ICD-10-CM | POA: Diagnosis not present

## 2022-06-24 DIAGNOSIS — K635 Polyp of colon: Secondary | ICD-10-CM | POA: Diagnosis not present

## 2022-06-29 DIAGNOSIS — R7303 Prediabetes: Secondary | ICD-10-CM | POA: Diagnosis not present

## 2022-06-29 DIAGNOSIS — Z0000 Encounter for general adult medical examination without abnormal findings: Secondary | ICD-10-CM | POA: Diagnosis not present

## 2022-06-29 DIAGNOSIS — I10 Essential (primary) hypertension: Secondary | ICD-10-CM | POA: Diagnosis not present

## 2022-06-29 DIAGNOSIS — Z23 Encounter for immunization: Secondary | ICD-10-CM | POA: Diagnosis not present

## 2022-10-03 IMAGING — DX DG ELBOW COMPLETE 3+V*L*
4 series · 4 of 4 positions shown · non-contrast
Comparison: None.

CLINICAL DATA: Pain following fall

EXAM:
LEFT ELBOW - COMPLETE 3+ VIEW

[elbow ap]
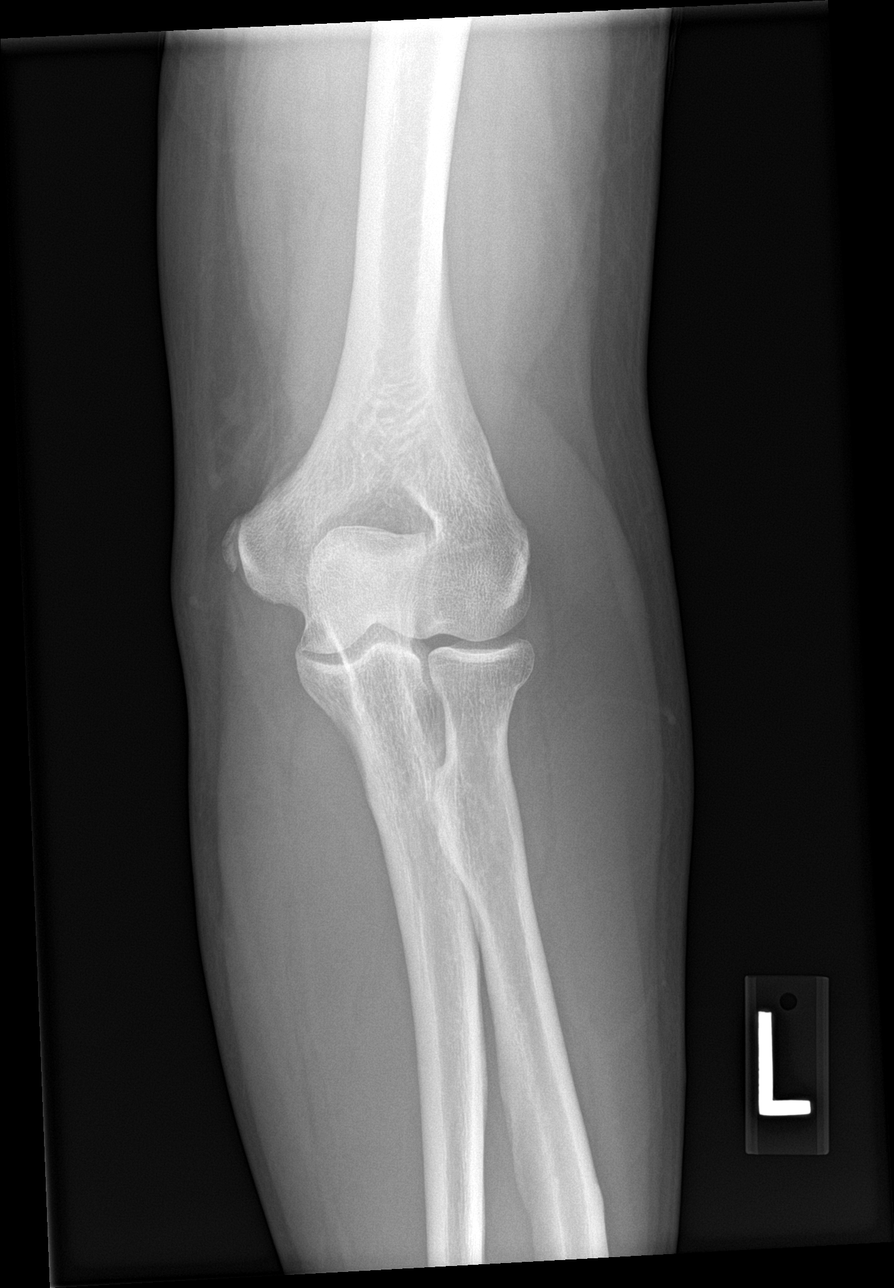

[elbow obl (1 of 2)]
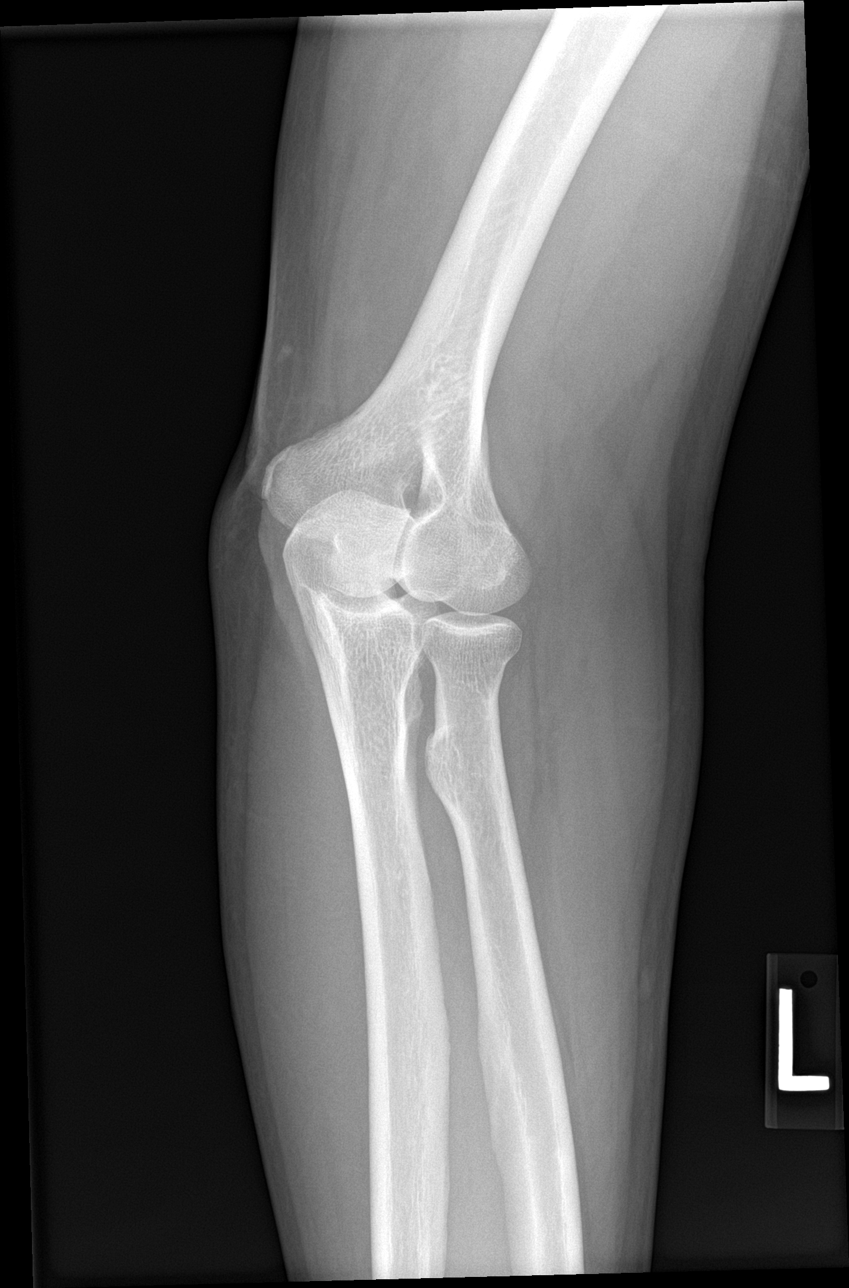

[elbow obl (2 of 2)]
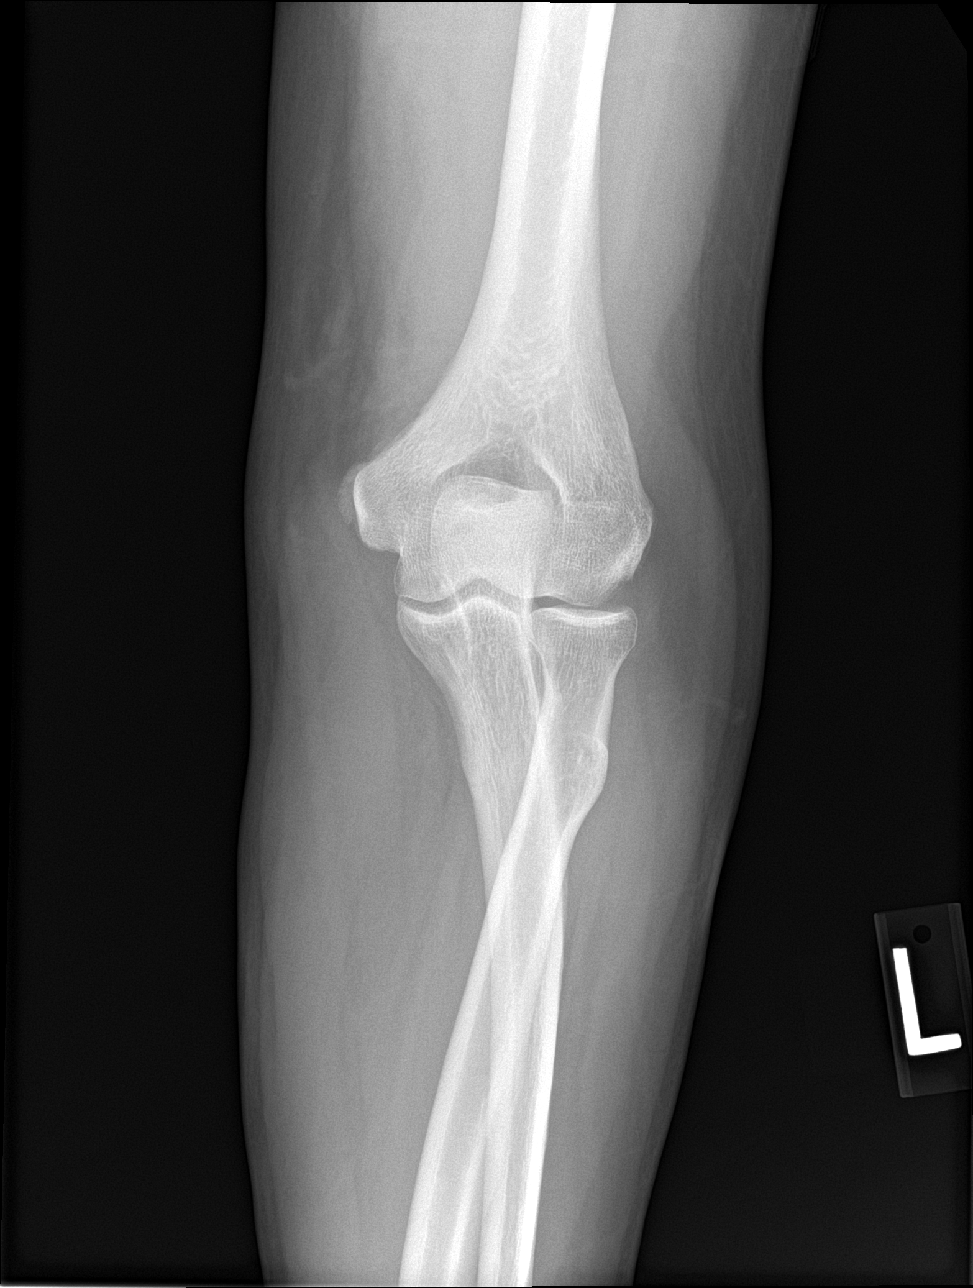

[elbow lat]
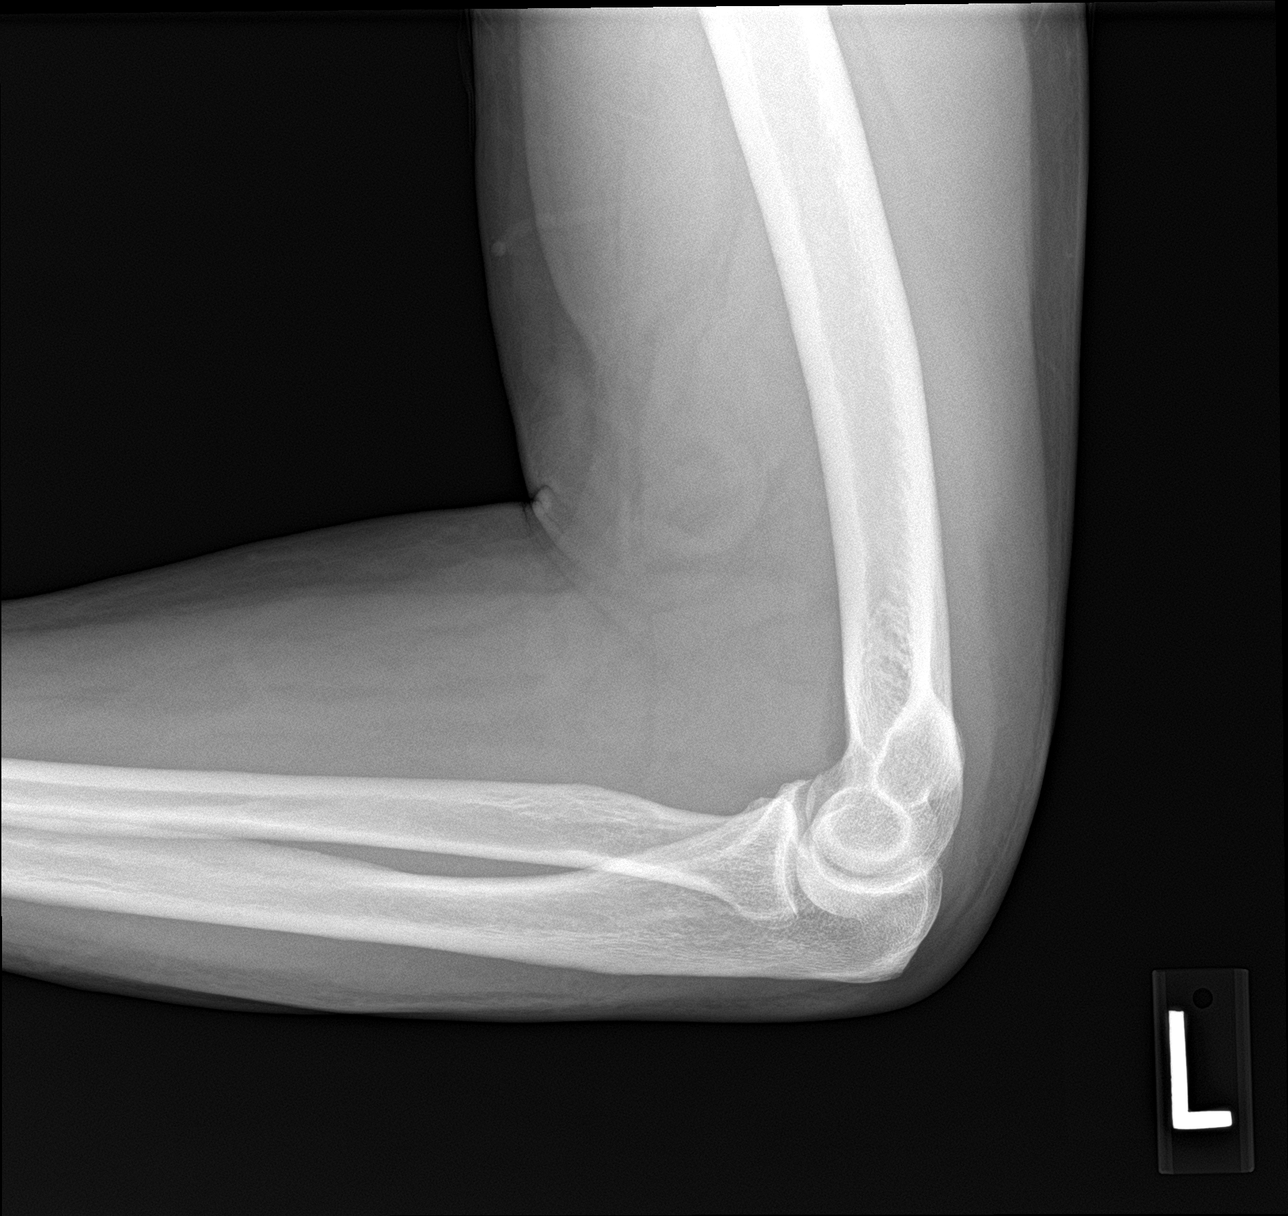

[4 of 4 positions shown; findings below may reference images not displayed]

FINDINGS: Frontal, lateral, and bilateral oblique views were obtained. There
is an apparent accessory ossicle lateral to the medial distal
humeral condyle. A subtle lucency in this area could represent a
nondisplaced fracture in this accessory ossicle. No other evident
potential fracture. No dislocation. No joint effusion. No joint
space narrowing or erosion.
IMPRESSION: Subtle lucency in an accessory ossicle adjacent to the medial aspect
of the distal humeral condyle. Question nondisplaced fracture in
this area. No other findings suggesting potential fracture. No
dislocation. No appreciable underlying arthropathic change.

These results will be called to the ordering clinician or
representative by the Radiologist Assistant, and communication
documented in the PACS or [REDACTED].
